# Patient Record
Sex: Female | Born: 1940 | Race: White | Hispanic: No | Marital: Married | Smoking: Current some day smoker
Health system: Southern US, Community
[De-identification: ages and names within clinical notes are randomized; demographics above are authoritative.]

## PROBLEM LIST (undated history)

## (undated) DIAGNOSIS — M1990 Unspecified osteoarthritis, unspecified site: Secondary | ICD-10-CM

## (undated) DIAGNOSIS — I739 Peripheral vascular disease, unspecified: Secondary | ICD-10-CM

## (undated) DIAGNOSIS — I10 Essential (primary) hypertension: Secondary | ICD-10-CM

## (undated) HISTORY — PX: PR VEIN BYPASS GRAFT,AORTO-FEM-POP: 35551

## (undated) HISTORY — DX: Unspecified osteoarthritis, unspecified site: M19.90

## (undated) HISTORY — PX: BELOW KNEE LEG AMPUTATION: SUR23

## (undated) HISTORY — DX: Peripheral vascular disease, unspecified: I73.9

## (undated) HISTORY — DX: Essential (primary) hypertension: I10

## (undated) HISTORY — PX: TOE AMPUTATION: SHX809

---

## 2007-01-07 ENCOUNTER — Ambulatory Visit: Payer: Self-pay | Admitting: Vascular Surgery

## 2007-01-10 ENCOUNTER — Ambulatory Visit: Payer: Self-pay | Admitting: Vascular Surgery

## 2007-01-10 ENCOUNTER — Inpatient Hospital Stay (HOSPITAL_COMMUNITY): Admission: RE | Admit: 2007-01-10 | Discharge: 2007-01-19 | Payer: Self-pay | Admitting: Vascular Surgery

## 2007-01-11 ENCOUNTER — Encounter: Payer: Self-pay | Admitting: Vascular Surgery

## 2007-01-13 ENCOUNTER — Encounter (INDEPENDENT_AMBULATORY_CARE_PROVIDER_SITE_OTHER): Payer: Self-pay | Admitting: Orthopedic Surgery

## 2007-01-17 ENCOUNTER — Ambulatory Visit: Payer: Self-pay | Admitting: Physical Medicine & Rehabilitation

## 2007-02-04 ENCOUNTER — Ambulatory Visit: Payer: Self-pay | Admitting: Vascular Surgery

## 2007-03-18 ENCOUNTER — Ambulatory Visit: Payer: Self-pay | Admitting: Vascular Surgery

## 2007-03-25 ENCOUNTER — Ambulatory Visit (HOSPITAL_COMMUNITY): Admission: RE | Admit: 2007-03-25 | Discharge: 2007-03-25 | Payer: Self-pay | Admitting: Orthopedic Surgery

## 2007-05-16 ENCOUNTER — Ambulatory Visit: Payer: Self-pay | Admitting: Vascular Surgery

## 2007-06-08 ENCOUNTER — Ambulatory Visit: Payer: Self-pay | Admitting: Vascular Surgery

## 2007-06-09 ENCOUNTER — Ambulatory Visit (HOSPITAL_COMMUNITY): Admission: RE | Admit: 2007-06-09 | Discharge: 2007-06-09 | Payer: Self-pay | Admitting: Vascular Surgery

## 2007-06-09 ENCOUNTER — Ambulatory Visit: Payer: Self-pay | Admitting: Surgery

## 2007-06-14 ENCOUNTER — Encounter: Payer: Self-pay | Admitting: Vascular Surgery

## 2007-06-14 ENCOUNTER — Inpatient Hospital Stay (HOSPITAL_COMMUNITY): Admission: RE | Admit: 2007-06-14 | Discharge: 2007-06-20 | Payer: Self-pay | Admitting: Vascular Surgery

## 2007-06-15 ENCOUNTER — Encounter: Payer: Self-pay | Admitting: Vascular Surgery

## 2007-06-20 ENCOUNTER — Ambulatory Visit: Payer: Self-pay | Admitting: Physical Medicine & Rehabilitation

## 2007-06-24 ENCOUNTER — Ambulatory Visit: Payer: Self-pay | Admitting: Vascular Surgery

## 2007-07-01 ENCOUNTER — Ambulatory Visit: Payer: Self-pay | Admitting: Vascular Surgery

## 2007-07-13 ENCOUNTER — Ambulatory Visit: Payer: Self-pay | Admitting: Vascular Surgery

## 2007-07-22 ENCOUNTER — Ambulatory Visit: Payer: Self-pay | Admitting: Vascular Surgery

## 2007-08-11 ENCOUNTER — Ambulatory Visit (HOSPITAL_COMMUNITY): Admission: RE | Admit: 2007-08-11 | Discharge: 2007-08-12 | Payer: Self-pay | Admitting: Orthopedic Surgery

## 2007-08-11 ENCOUNTER — Encounter (INDEPENDENT_AMBULATORY_CARE_PROVIDER_SITE_OTHER): Payer: Self-pay | Admitting: Orthopedic Surgery

## 2007-10-05 ENCOUNTER — Ambulatory Visit (HOSPITAL_COMMUNITY): Admission: RE | Admit: 2007-10-05 | Discharge: 2007-10-06 | Payer: Self-pay | Admitting: Orthopedic Surgery

## 2007-10-05 ENCOUNTER — Encounter (INDEPENDENT_AMBULATORY_CARE_PROVIDER_SITE_OTHER): Payer: Self-pay | Admitting: Orthopedic Surgery

## 2007-11-18 ENCOUNTER — Ambulatory Visit: Payer: Self-pay | Admitting: Vascular Surgery

## 2007-11-30 ENCOUNTER — Encounter (INDEPENDENT_AMBULATORY_CARE_PROVIDER_SITE_OTHER): Payer: Self-pay | Admitting: Orthopedic Surgery

## 2007-11-30 ENCOUNTER — Inpatient Hospital Stay (HOSPITAL_COMMUNITY): Admission: RE | Admit: 2007-11-30 | Discharge: 2007-12-02 | Payer: Self-pay | Admitting: Orthopedic Surgery

## 2007-12-01 ENCOUNTER — Ambulatory Visit: Payer: Self-pay | Admitting: Physical Medicine & Rehabilitation

## 2007-12-02 ENCOUNTER — Ambulatory Visit: Payer: Self-pay | Admitting: Physical Medicine & Rehabilitation

## 2007-12-02 ENCOUNTER — Inpatient Hospital Stay (HOSPITAL_COMMUNITY)
Admission: RE | Admit: 2007-12-02 | Discharge: 2007-12-16 | Payer: Self-pay | Admitting: Physical Medicine & Rehabilitation

## 2008-02-17 ENCOUNTER — Ambulatory Visit: Payer: Self-pay | Admitting: Vascular Surgery

## 2008-02-22 ENCOUNTER — Ambulatory Visit (HOSPITAL_COMMUNITY): Admission: RE | Admit: 2008-02-22 | Discharge: 2008-02-22 | Payer: Self-pay | Admitting: Orthopedic Surgery

## 2008-06-07 ENCOUNTER — Encounter: Admission: RE | Admit: 2008-06-07 | Discharge: 2008-08-09 | Payer: Self-pay | Admitting: Orthopedic Surgery

## 2008-11-16 ENCOUNTER — Ambulatory Visit: Payer: Self-pay | Admitting: Vascular Surgery

## 2009-06-07 ENCOUNTER — Ambulatory Visit: Payer: Self-pay | Admitting: Vascular Surgery

## 2010-01-24 ENCOUNTER — Ambulatory Visit: Payer: Self-pay | Admitting: Vascular Surgery

## 2010-08-19 NOTE — Procedures (Signed)
LOWER EXTREMITY ARTERIAL EVALUATION-SINGLE LEVEL   INDICATION:  Followup bypass graft.   HISTORY:  Diabetes:  Yes.  Cardiac:  No.  Hypertension:  Yes.  Smoking:  No.  Previous Surgery:  Left superficial femoral to below-knee popliteal  artery bypass graft with reverse saphenous vein on 01/10/2007, by Dr.  Arbie Cookey.   RESTING SYSTOLIC PRESSURES: (ABI)                          RIGHT                LEFT  Brachial:               170                  170  Anterior tibial:        120 (0.71)           142 (0.83)  Posterior tibial:       110                  134  Peroneal:  DOPPLER WAVEFORM ANALYSIS:  Anterior tibial:        Monophasic.          Monophasic.  Posterior tibial:       Monophasic.          Biphasic.  Peroneal:   PREVIOUS ABI'S:  Date: 01/11/2007  RIGHT:  0.80  LEFT:  0.59   IMPRESSION:  1. Right ABI is slightly decreased.  2. Left ABI is improved from previous study.   ___________________________________________  [name], M.D.   DP/MEDQ  D:  02/04/2007  T:  02/06/2007  Job:  [id number]

## 2010-08-19 NOTE — Op Note (Signed)
NAME:  [name] [name], HARLACHER NO.:  [id number]   MEDICAL RECORD NO.:  [id number]          PATIENT TYPE:  INP   LOCATION:  2807                         FACILITY:  [facility]   PHYSICIAN:  Di Kindle. Edilia Bo, M.D.DATE OF BIRTH:  11-25-1940   DATE OF PROCEDURE:  01/10/2007  DATE OF DISCHARGE:                               OPERATIVE REPORT   PREOPERATIVE DIAGNOSIS:  Nonhealing wound of the left foot.   POSTOPERATIVE DIAGNOSIS:  Nonhealing wound of the left foot.   PROCEDURES:  1. Aortogram.  2. Bilateral iliac arteriogram.  3. Bilateral lower extremity runoff.  4. Selective left external iliac arteriogram.   INDICATIONS:  This is a 70 year old woman who presented with a  nonhealing wound of the left foot.  She was evaluated by Dr. Arbie Cookey, and  arteriography was recommended in order to determine if she was a  candidate for revascularization.   TECHNIQUE:  The patient was taken to the PV Lab at [facility].  She was  sedated with 1 mg of Versed and 25 mcg of fentanyl.  Both groins were  prepped and draped in the usual sterile fashion.  After the skin was  infiltrated with 1% lidocaine, the right common femoral artery was  cannulated and a guidewire introduced into the infrarenal aorta under  fluoroscopic control.  A 5-French sheath was introduced over the wire.  The dilator was removed and then a pigtail catheter advanced over the  wire and positioned at the L1 vertebral body.  Flush aortogram was  obtained.  Catheter was then repositioned above the aortic bifurcation,  and an oblique iliac projection was obtained.  Bilateral lower extremity  runoff films were then obtained.  Next, the pigtail catheter was  exchanged for an IMA catheter which was positioned into the proximal  left common iliac artery.  An angled Glidewire was advanced down into  the external iliac artery, and then the IMA catheter was exchanged for  an end-hole catheter.  Selective spot films were obtained on the  left of  the left leg.   FINDINGS:  There are single renal arteries bilaterally, with no  significant renal artery stenosis identified.  The infrarenal aorta is  widely patent, with no significant stenosis.  Bilateral common iliac,  external iliac, and hypogastric arteries are patent.   On the right side, the common femoral, superficial femoral, and deep  femoral artery are patent.  There is some mild disease at the above-knee  popliteal artery.  Below-knee popliteal artery is patent.  There is  diffuse disease of the anterior tibial and posterior tibial arteries,  which both reconstitute distally.  The peroneal artery is patent on the  right.   On the left side, the common femoral and deep femoral artery are patent.  The superficial femoral artery is a patent down to the adductor canal.  The above-knee popliteal artery has an occlusion  over about 4-5 cm.  There is moderate disease of the popliteal artery at the level of the  knee.  Below-knee popliteal artery is patent.  There is three-vessel  runoff on the left, although there is  some mild disease in the proximal  anterior tibial artery on the left.   CONCLUSIONS:  1. No significant aortoiliac occlusive disease.  2. Bilateral superficial femoral artery occlusive disease at the      adductor canal.  3. Tibial occlusive disease, as described above.      Di Kindle. Edilia Bo, M.D.  Electronically Signed     CSD/MEDQ  D:  01/10/2007  T:  01/10/2007  Job:  [id number]

## 2010-08-19 NOTE — Assessment & Plan Note (Signed)
OFFICE VISIT   [name] [name], [name] [name]  DOB:  1940/10/03                                       07/01/2007  ZOXWR#:[id number]   The patient presents today for continued followup of her right foot.  She continues to have a palpable graft pulse in the popliteal space.  She does have an eschar over her posterior lateral heel, which is  approximately 2 cm in diameter.  Her sutures were removed from her fifth  toe amputation today.  She has an eschar over the tips of her great toe  on the right.  I am pleased with her overall progress.  She will  continue her usual bathing and dry dressing to her foot.  I will see her  again in 2 weeks for continued followup.   [name], M.D.  Electronically Signed   TFE/MEDQ  D:  07/01/2007  T:  07/04/2007  Job:  [id number]

## 2010-08-19 NOTE — H&P (Signed)
HISTORY AND PHYSICAL EXAMINATION   January 07, 2007   Re:  [name], [name]                 DOB:  12-20-1940   CHIEF COMPLAINT:  Left foot ulceration.   HISTORY OF PRESENT ILLNESS:  The patient is a 70 year old white female  with a 2 month history of progressive tissue loss in the left lateral  foot.  She has been followed extensively there by Dr. Javier Glazier at  a wound center and has had progressive tissue loss despite antibiotics  and local wound care.  I have seen her for further evaluation and lower  extremity arterial Doppler studies confirmed severe left lower extremity  arterial insufficiency and therefore she is admitted on 01/10/2007 for  theratography and probable bypass on 01/11/2007 depending on her  arteriogram.   PAST MEDICAL HISTORY:  Significant for insulin dependent diabetes for 4  years, she is hypertensive, does not have elevated cholesterol, no prior  cardiac history.   SOCIAL HISTORY:  She is married with 2 children.  Does not smoke or  drink alcohol.   REVIEW OF SYSTEMS:  She is 5 foot 7 inches tall.  She has no cardiac  history, specifically no atrial fibrillation or coronary artery disease.  No pulmonary disease.  She does have a history of peptic ulcer disease  from GI review.  No kidney issues.  She essentially has no history of  renal insufficiency.  She does have a history of arthritis and poor  eyesight.   ALLERGIES:  No drug allergies.   MEDICATIONS:  1. Metoprolol 100 mg 2 daily.  2. Glimepiride 4 mg 2 daily.  3. Citalopram 20 mg 1 daily.  4. Omeprazole 20 mg daily.  5. Lisinopril 10 mg daily.  6. Iron sulfate 325 mg 2 daily.  7. Lipitor 10 mg daily.  8. Centrum Silver 1 daily.  9. Aspirin 1 daily.  10.Amoxicillin p.o.  11.Zyvox p.o.   PHYSICAL EXAMINATION:  General:  Well developed and well nourished white  female appearing stated age of 2.  Vital signs:  Blood pressure is  126/60, pulse 66, respirations 22.   Carotid arteries without bruits  bilaterally.  She has 2+ radial pulses, 2+ femoral pulses bilaterally.  She has absent left popliteal and distal pulses and absent right distal  pulses.  Her left foot is noted for extensive necrotic ulcer over the  lateral mid foot extending into her metatarsal bone with bone exposed.   She has noninvasive vascular laboratory studies from [facility] and  this reveals an ABI  of 0.3 on the left and 0.6 on the right.  I had a  long discussion with the patient and her husband present.  I explained  that she clearly does not have adequate flow to heal this wound.  I am  also concerned that with her ulcerations that even with the normal flow  it will be quite difficult to get adequate coverage with a partial foot  amputation.  She is scheduled for urgent arteriogram on 10/06 with Dr.  Edilia Bo and will tentatively undergo angioplasty with possible bypass by  me on 10/07.  She and her husband understand that there is a high risk  for limb loss related to this process.   [name], M.D.  Electronically Signed   TFE/MEDQ  D:  01/07/2007  T:  01/10/2007  Job:  [id number]   cc:   Javier Glazier, M.D.  Shari Prows, M.D.

## 2010-08-19 NOTE — Op Note (Signed)
NAME:  [name] [name], [name] [name] NO.:  [id number]   MEDICAL RECORD NO.:  [id number]          PATIENT TYPE:  INP   LOCATION:  2550                         FACILITY:  [facility]   PHYSICIAN:  [name], M.D.    DATE OF BIRTH:  February 06, 1941   DATE OF PROCEDURE:  DATE OF DISCHARGE:                               OPERATIVE REPORT   PREOPERATIVE DIAGNOSIS:  Nonhealing left foot wound with ischemia.   POSTOPERATIVE DIAGNOSIS:  Nonhealing left foot wound with ischemia.   PROCEDURE:  Left superficial femoral artery to below-knee popliteal  artery bypass with reverse saphenous vein.   SURGEON:  [name], M.D.   ASSISTANT:  Jerold Coombe, P.A.-C   ANESTHESIA:  General endotracheal.   COMPLICATIONS:  None.  He was dismissed from the recovery room stable.   PROCEDURE IN DETAILS:  The patient was taken to the room and placed in  position where the area of the left groin and left leg were prepped and  draped in the usual sterile fashion.  Ultrasound was used to visualize  the saphenous vein, which was very superficial and of moderate size.  The vein was exposed through the below-knee calf and incision was made  from this level to the mid thigh.  The vein was harvested.  The  tributary branches were ligated with 3-0 and 4-0 silk ties and divided.  The vein was ligated distally and was gently dilated and was adequate  for anastomosis and bypass.  The below-knee popliteal artery was exposed  through the medial approach and was somewhat calcified, but did have  area possible for anastomosis.  Next, the superficial femoral artery was  exposed through the medial approach through the same vein harvest  incision and the mid thigh.  The artery had a good pulse at this level  and was controlled with vessel loops.  A tunnel was created between the  level of the below-knee popliteal artery and the superficial femoral  artery.  The patient was given 8,000 units of intravenous heparin.  After adequate circulation time, the superficial femoral artery was  occluded proximally and distally and was opened with a 11 blade and  incision extended with Potts scissors.  The vein was brought into the  field.  It was reversed, spatulated, and sewn end-to-side to the mid to  distal superficial femoral artery with a running 6-0 Prolene suture.  The anastomosis was tested and found to be adequate.  The vein was then  brought through the prior created tunnel down to the level of the below-  knee popliteal artery.  The below-knee popliteal artery was occluded  proximally and distally with vessel loops and was opened with a 11 blade  and incision extended with Potts scissors.  There was good back-bleeding  from the artery.  The vein was spatulated and sewn end-to-side to the  artery with a running 6-0 Prolene suture.  Prior to completion of the  anastomosis, the usual flushing maneuvers were undertaken.  The  anastomosis was completed, clamps were removed and good graft dependent  Doppler flow was noted in the  posterior tibial and peroneal arteries.  The patient was given 50 mg of protamine to reverse the heparin.  The  wound  was irrigated with saline.  Hemostasis with electrocautery.  Wounds were  closed with 2-0 Vicryl in the subcutaneous tissue and fascia.  The skin  was closed with 3-0 subcuticular Vicryl stitch.  A sterile dressing was  applied.  The patient was taken to the recovery room in stable  condition.      [name], M.D.  Electronically Signed     TFE/MEDQ  D:  01/11/2007  T:  01/11/2007  Job:  [id number]

## 2010-08-19 NOTE — Op Note (Signed)
NAME:  [name] [name], [name] [name] NO.:  [id number]   MEDICAL RECORD NO.:  [id number]          PATIENT TYPE:  OIB   LOCATION:  5039                         FACILITY:  [facility]   PHYSICIAN:  Nadara Mustard, MD     DATE OF BIRTH:  [birth year]   DATE OF PROCEDURE:  08/11/2007  DATE OF DISCHARGE:                               OPERATIVE REPORT   PREOPERATIVE DIAGNOSES:  Osteomyelitis and gangrene, right great toe.   POSTOPERATIVE DIAGNOSES:  Osteomyelitis and gangrene, right great toe.   PROCEDURE:  Right foot first ray amputation.   SURGEON:  Nadara Mustard, MD.   ANESTHESIA:  General.   ESTIMATED BLOOD LOSS:  Minimal.   ANTIBIOTICS:  Kefzol 1 g.   DRAINS:  None.   COMPLICATIONS:  None.   TOURNIQUET TIME:  None.   DISPOSITION:  PACU in stable condition.   INDICATIONS FOR PROCEDURE:  The patient is a 70 year old woman with type  2 diabetes, peripheral vascular disease status post revascularization  with some ischemic changes to the vascular incision with gangrene of the  great toe with osteomyelitis.  She has failed conservative care.  She  has had progressive gangrenous changes and presented at this time for  first ray amputation.  Risks and benefits were discussed including  infection, neurovascular injury, nonhealing of the wound, and need for  the higher-level amputation.  The patient states she understands and  wish to proceed at this time.   DESCRIPTION OF PROCEDURE:  The patient was brought to the OR room-5 and  underwent a general anesthetic.  After adequate level of anesthesia  obtained, the patient's right lower extremity was prepped using DuraPrep  and draped in a sterile field.  A racket incision was made around the  great toe.  The great toe was resected through the base of the first  metatarsal and the great toe first metatarsal and sesamoids were  resected in one block of tissue.  The wound was irrigated with normal  saline.  The electrocautery was used  for hemostasis.  The wound was  closed using  a 2-0 nylon with a modified vertical mattress suture.  The wound was  covered with Adaptic, orthopedic sponges, sterile Kerlix and a Coban  compressive wrap was applied for the entire right lower extremity due to  her venostasis insufficiency and swelling.  She will be admitted for 23-  hour observation and discharged in the morning.      Nadara Mustard, MD  Electronically Signed     MVD/MEDQ  D:  08/11/2007  T:  08/12/2007  Job:  [id number]

## 2010-08-19 NOTE — Procedures (Signed)
BYPASS GRAFT EVALUATION   INDICATION:  Follow-up evaluation of lower extremity bypass graft.   HISTORY:  Diabetes:  Yes.  Cardiac:  No.  Hypertension:  Yes.  Smoking:  Rarely.  Previous Surgery:  Left superficial femoral to below-knee pop bypass  graft with reverse saphenous vein on 01/10/07 by Dr. Arbie Cookey.   SINGLE LEVEL ARTERIAL EXAM                               RIGHT              LEFT  Brachial:                    166                168  Anterior tibial:             20                 108  Posterior tibial:            38                 130  Peroneal:  Ankle/brachial index:        0.23               0.78   PREVIOUS ABI:  Date: 02/04/07  RIGHT:  0.71  LEFT:  0.83   LOWER EXTREMITY BYPASS GRAFT DUPLEX EXAM:   DUPLEX:  1. Doppler arterial waveforms are biphasic proximal to, within, and      distal to the bypass graft with a peak systolic velocity of 335      cm/s with a proximal anastomosis of the graft.  2. On the right, waveforms are biphasic in the common femoral and      superficial femoral artery and monophasic in the popliteal artery      with a peak systolic velocity of 266 cm/s at the proximal portion      of the right popliteal artery.   IMPRESSION:  1. Left ankle brachial index is stable from previous study.  2. Patent left superficial femoral to popliteal artery bypass graft.  3. Right ankle brachial index is lower than previously recorded with a      right popliteal artery stenosis.   ___________________________________________  [name], M.D.   MC/MEDQ  D:  05/16/2007  T:  05/17/2007  Job:  [id number]

## 2010-08-19 NOTE — Procedures (Signed)
BYPASS GRAFT EVALUATION   INDICATION:  Followup left fem to pop bypass grafts.   HISTORY:  Diabetes:  Yes.  Cardiac:  No.  Hypertension:  Yes.  Smoking:  Yes.  Previous Surgery:  Left superficial femoral artery to popliteal bypass  graft, 01/10/2007.  Right superficial femoral artery to popliteal bypass  graft, 06/14/2007.  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   SINGLE LEVEL ARTERIAL EXAM                               RIGHT              LEFT  Brachial:                    189                185  Anterior tibial:                                128  Posterior tibial:                               158  Peroneal:  Ankle/brachial index:        BKA                0.84   PREVIOUS ABI:  Date:  06/07/2009  RIGHT:  BKA  LEFT:  0.81   LOWER EXTREMITY BYPASS GRAFT DUPLEX EXAM:   DUPLEX:  Patent left femoral-popliteal bypass graft with elevated  velocities at the proximal anastomosis of 485 cm/sec.   IMPRESSION:  1. Right below the knee amputation.  2. Stable ankle brachial index from previous study.  3. Patent left femoral-popliteal bypass graft with elevated      velocities, as described above.   ___________________________________________  [name], M.D.   EM/MEDQ  D:  01/24/2010  T:  01/24/2010  Job:  [id number]

## 2010-08-19 NOTE — Op Note (Signed)
NAME:  [name] [name], [name] [name] NO.:  [id number]   MEDICAL RECORD NO.:  [id number]          PATIENT TYPE:  OIB   LOCATION:  5003                         FACILITY:  [facility]   PHYSICIAN:  Nadara Mustard, MD     DATE OF BIRTH:  January 20, 1941   DATE OF PROCEDURE:  DATE OF DISCHARGE:                               OPERATIVE REPORT   PREOPERATIVE DIAGNOSES:  1. Gangrene and osteomyelitis, right forefoot.  2. Achilles contracture.   POSTOPERATIVE DIAGNOSES:  1. Gangrene and osteomyelitis, right forefoot.  2. Achilles contracture.   PROCEDURE:  1. Right transmetatarsal amputation.  2. Percutaneous Achilles lengthening.   SURGEON:  Nadara Mustard, MD   ANESTHESIA:  General.   ESTIMATED BLOOD LOSS:  Minimal.   ANTIBIOTICS:  1 g of Kefzol.   DRAINS:  None.   COMPLICATIONS:  None.   TOURNIQUET TIME:  None.   SPECIMEN:  Sent to pathology.   DISPOSITION:  To the PACU in stable condition.   INDICATIONS FOR PROCEDURE:  The patient is a 70 year old woman who is  status post medial and lateral column amputations.  She has developed  progressive infection over the second metatarsal with an open wound with  a Wagner grade 3 ulcer.  Due to the progressive wound necrosis distally,  the patient is not felt to be a good candidate for additional ray  amputation and presents at this time for transmetatarsal amputation.  She does have Achilles contracture, and we will plan for Achilles  lengthening.  Risks and benefits were discussed including infection,  neurovascular injury, persistent pain, nonhealing wound, and need for  additional surgery.  The patient states she understands and wished to  proceed at this time.   DESCRIPTION OF PROCEDURE:  The patient was brought to OR room 15,  underwent general anesthetic.  After adequate level of anesthesia was  obtained, the patient's right lower extremity was prepped using DuraPrep  and draped into a sterile field.  A fishmouth incision was  made, and a  beveled transmetatarsal amputation was performed.  The plantar aspect of  the bones was beveled and dorsally this was also beveled as well.  The  wound was irrigated with normal saline, and the skin was closed using 2-  0 nylon without any tension on the skin.  A percutaneous Z Achilles  lengthening was then performed with equinus contracture of about 10  degrees.  Preoperatively and postoperatively, she had dorsiflexion of  about 30 degrees.  The wounds were covered with Adaptic, orthopedic  sponges, ABD dressing, Kerlix, and Coban.  The patient was extubated and  taken to the PACU in stable condition.  Plan for a 24-hour observation.  Discharge to home tomorrow.      Nadara Mustard, MD  Electronically Signed     MVD/MEDQ  D:  10/05/2007  T:  10/06/2007  Job:  [id number]

## 2010-08-19 NOTE — Op Note (Signed)
NAME:  [name] [name], [name] [name] NO.:  [id number]   MEDICAL RECORD NO.:  [id number]          PATIENT TYPE:  AMB   LOCATION:  SDS                          FACILITY:  [facility]   PHYSICIAN:  Nadara Mustard, MD     DATE OF BIRTH:  08-13-1940   DATE OF PROCEDURE:  02/22/2008  DATE OF DISCHARGE:                               OPERATIVE REPORT   PREOPERATIVE DIAGNOSIS:  Chronic wound, right transtibial amputation.   POSTOPERATIVE DIAGNOSIS:  Chronic wounds, right transtibial amputation.   PROCEDURE:  Irrigation and debridement of the wound and applied 1 unit  of Apligraf.   SURGEON:  Nadara Mustard, MD.   ANESTHESIA:  None.   DRAINS:  None.   COMPLICATIONS:  None.   DISPOSITION:  To home.   INDICATIONS FOR PROCEDURE:  The patient is a 67-year-woman, status post  transtibial amputation on the right with a chronic wound, which has not  healed.  She presents at this time for application of Apligraf.   DESCRIPTION OF PROCEDURE:  The patient was brought for short stay.  The  wound was cleansed with saline.  Fibrinous exudate was removed from the  wound.  Apligraf was meshed, applied to the wound, and Mepilex dressings  with Bactroban was applied with compressive Coban dressing.  The patient  was discharged to home.  Follow up in the office on Friday.      Nadara Mustard, MD  Electronically Signed     MVD/MEDQ  D:  02/22/2008  T:  02/23/2008  Job:  [id number]

## 2010-08-19 NOTE — Procedures (Signed)
BYPASS GRAFT EVALUATION   INDICATION:  Followup, bilateral lower extremity bypass graft.   HISTORY:  Diabetes:  Yes.  Cardiac:  No.  Hypertension:  Yes.  Smoking:  Rarely.  Previous Surgery:  Left SFA to popliteal artery bypass graft on 01/10/07  by Dr. Arbie Cookey.  Right SFA to popliteal bypass graft on 06/14/07 by Dr.  Arbie Cookey.  Partial right foot amputation, July, 2009.   SINGLE LEVEL ARTERIAL EXAM                               RIGHT              LEFT  Brachial:                    184                176  Anterior tibial:             130                148  Posterior tibial:            110                150  Peroneal:  Ankle/brachial index:        0.71               0.81   PREVIOUS ABI:  Date: 06/15/07  RIGHT:  0.56  LEFT:  0.72   LOWER EXTREMITY BYPASS GRAFT DUPLEX EXAM:   DUPLEX:  1. Doppler arterial waveforms on the right appear monophasic proximal      to, within, and distal to bypass graft, on left biphasic.  2. Elevated velocities in right proximal anastomosis of 226 cm/s,      right mid/distal graft of 491 cm/s.  On the left, proximal      anastomosis velocities of 407 cm/s and in the proximal graft of 373      cm/s.     IMPRESSION:  1. Right ankle brachial index shows increase from a previous study and      left appears stable; however, bilateral monophasic Doppler signals      were noted at the ankles.  2. Elevated velocities noted in bilateral proximal anastomoses and      within the grafts.  3. Note:  Right foot bandage and postoperative conditions.      ___________________________________________  [name], M.D.   AS/MEDQ  D:  11/18/2007  T:  11/18/2007  Job:  [id number]

## 2010-08-19 NOTE — Assessment & Plan Note (Signed)
OFFICE VISIT   [name], [name]  DOB:  November 03, 1940                                       07/22/2007  CHART#:[id number]   [name] is here today for followup of her right fem-pop bypass.  I  had seen her 10 days ago with some new erythema at her below-knee  popliteal incision.  This is now opened slightly.  This was debrided in  the office today and packed.  She will begin packing this on this on a  daily basis.  Her foot actually looks quite good.  Her 5th toe  amputation is healed.  She has nice separating of the eschar on her heel  and her great toe now is healing as well.  We will see her again in 3  weeks for continued followup.   [name], M.D.  Electronically Signed   TFE/MEDQ  D:  07/22/2007  T:  07/25/2007  Job:  [id number]

## 2010-08-19 NOTE — H&P (Signed)
NAME:  VIVION, ROMANO NO.:  [id number]   MEDICAL RECORD NO.:  [id number]          PATIENT TYPE:  IPS   LOCATION:  4010                         FACILITY:  [facility]   PHYSICIAN:  Ranelle Oyster, M.D.DATE OF BIRTH:  December 10, 1940   DATE OF ADMISSION:  12/02/2007  DATE OF DISCHARGE:                              HISTORY & PHYSICAL   CHIEF COMPLAINT:  Right leg/foot pain.   HISTORY OF PRESENT ILLNESS:  This is a 70 year old white female with  diabetes and hypertension with chronic peripheral vascular disease  status post multiple bypass grafts and amputations.  She had a right  great toe amputation with gangrenous changes and osteomyelitis.  She  underwent a right transmetatarsal amputation in July 2009 with attempts  at limb salvage.  She continued to have infection and abscess formation  of right foot ultimately requiring a right transtibial amputation on  November 30, 2007.  The patient has been followed by PT and OT and still  needs significant assistance with mobility and self-care, and thus it is  felt after rehab evaluation that she could benefit from an inpatient  rehab stay.   REVIEW OF SYSTEMS:  Notable for occasional bladder incontinence,  although she states that her habits are normal.  Pain has been under  fair control. Mood has been poor.  She denies shortness of breath or  chest pain.  Full review is in the written H and P.   PAST MEDICAL HISTORY:  Positive for diabetes type 2, insulin dependent,  CVA with left hemiparesis that is resolved, hypertension, dyslipidemia,  depression, PVD with left fem-pop bypass graft in October 2008, diabetic  neuropathy, history of peptic ulcer disease, right toe amputation in May  2009, right transmetatarsal amputation in July 2009, laser surgery in  bilateral eyes for diabetic retinopathy, left fifth toe amputation in  October 2008, and I&D in December 2008.   FAMILY HISTORY:  Positive for heart valve disease.   SOCIAL HISTORY:  The patient is married, lives in one-level house with  no steps to enter.  Husband is an amputee.  She has hired help 5 times a  week for about 4 hours with friends and family assisting as needed.  She  smokes 2-3 cigarettes per day.  She does not drink.   FUNCTIONAL STATUS:  The patient was independent for transfers and  limited ambulation since her right foot surgeries.  She is  nonweightbearing right lower extremity and used a walker and wheelchair  prior to coming in.  Currently, she is max assist for sit to stand  transfer, min assist for scoot to edge of the bed and to stand on left  leg.   MEDICATIONS AT HOME:  1. Hydrocodone p.r.n.  2. Doxycycline 100 mg b.i.d.  3. Lantus insulin 35 units q.h.s.  Sliding scale insulin 5 units      Humalog plus additional according to blood sugar with meals.  4. Celexa 20 mg daily.  5. Omeprazole 20 mg daily.  6. Amlodipine 10 mg daily.  7. Metoprolol 100 mg b.i.d.  8. Lisinopril 10 mg daily.  9.  Iron sulfate 325 mg daily.  10.Lipitor 10 mg daily.  11.Vitamin B6 100 mg b.i.d.   LABS:  Hemoglobin 10.8, white count 11,000, platelets 322,000, sodium  137, potassium 4.9, BUN and creatinine 6 and 0.8.   PHYSICAL EXAMINATION:  Blood pressure 129/56, pulse is 70, respiratory  rate 18, temperature is 98.5.  The patient is lying in bed, appears a  bit fat, in no acute distress.  Pupils equal, round, and reactive to  light.  Ears, nose, and throat exam is unremarkable.  Dentition was poor  with several missing teeth.  Neck is supple without JVD or  lymphadenopathy.  Chest is notable for occasional rhonchi, but otherwise  unremarkable.  Heart is regular rate and rhythm with systolic murmur.  Extremities showed no clubbing, cyanosis or edema.  Right leg was  wrapped in immediate postoperative dressing.  Abdomen is soft and  nontender.  Bowel sounds are positive.  Skin is generally intact.  On  the left foot, she did have some  dryness along the lateral aspect of the  foot.  Neurologically cranial nerves II-XII revealed decreased visual  acuity on eye exam, otherwise unremarkable.  Reflexes are 1+.  Sensation  is decreased distally.  Strength is generally 4-5/5 in the upper  extremities.  She is able to lift the right leg briefly against gravity.  Left lower extremity is 2+ to 3/5 proximal and 3/5 distally with some  pain noted behind the knee with lifting.  Judgment and orientation were  normal.  Memory was intact and mood was flat.   ASSESSMENT AND PLAN:  1. 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  The patient was admitted to receive      collaborative interdisciplinary care between the physiatrist, rehab      nursing staff, and therapy team here on the Inpatient Rehab Unit.      The patient's level of medical complexity and substantial therapy      needs in context of the medical necessity cannot be provided at a      lesser intensity of care.  Physiatrist will provide 24-hour mgt of      medical needs as well as oversight of therapy, plan treatment,      provide guidance as appropriate regarding the interaction of the      two.  24-hour rehab nursing will assist in the management of the      patient's skin care as well as pain, safety needs, appropriate      nutrition, and medication administration.  They also will help      integrate therapy concepts, techniques education, etc.  PT will      assess and treat for pre-ambulation mobility including wheelchair      transfers, wheelchair mobility, and appropriate safety and      equipment needs.  OT will assess and treat for range of motion in      the upper extremities as well as ADLs and safety as well as      equipment.  Case management/social worker will assess for      psychosocial needs and discharge planning.  Team conferences will      be held weekly to establish goals, assess progress, and  to      determine barriers at discharge.  Rehab goals are modified      independent at the wheelchair level.  Estimated length of stay is      10  days.  Prognosis good.  2. Diabetes type 2:  Continue with Lantus 35 units h.s. with a      sensitive sliding scale insulin upon admission to rehab.  Observe      CBGs daily and adjust as appropriate.  3. Hypertension:  Continue Norvasc, metoprolol, and Prinivil  will be      halted secondary to a recent increase in potassium up to 5.2 and      renal insufficiency.  Push fluids and recheck labs on Monday.  4. Deep vein thrombosis prophylaxis, subcu Lovenox.  5. Dyslipidemia:  Zocor.  6. Postoperative anemia:  Continue iron supplementation and recheck      blood count on Monday.  Observe for signs and symptoms of anemia      while with rehab.  7. Leukocytosis:  We will follow clinically for now.  There are no      symptoms of active infection.  We will check      CBC on Monday, December 05, 2007.  8. Pain management:  We will observe for now with p.r.n. oxycodone and      Tylenol as well as Robaxin for spasms.  Pain seems to be under fair      control at the moment.      Ranelle Oyster, M.D.  Electronically Signed     ZTS/MEDQ  D:  12/02/2007  T:  12/03/2007  Job:  [id number]

## 2010-08-19 NOTE — Assessment & Plan Note (Signed)
OFFICE VISIT   [name], [name]  DOB:  12/31/1940                                       02/04/2007  CHART#:[id number]   The patient presents today for followup of her admission for left  superficial femoral artery to below-knee popliteal artery bypass with a  reverse saphenous vein, and partial foot amputation by Dr. Aldean Baker.  She looks quite good today.  She has healed her leg incisions, and has a  good popliteal pulse.  Her ankle-brachial index has improved to 0.83 on  the left.  She does have good healing of her foot, and actually saw Dr.  Lajoyce Corners earlier today.  She does have an open area still on the lateral  aspect more proximally, but this does appear to be healing with no  evidence of infection.  I am quite pleased with her initial result.  Plan to see her again in 6 weeks for continued followup.   [name], M.D.  Electronically Signed   TFE/MEDQ  D:  02/04/2007  T:  02/08/2007  Job:  [id number]   cc:   Nadara Mustard, MD  Shari Prows, MD  Axel Filler, MD

## 2010-08-19 NOTE — Procedures (Signed)
BYPASS GRAFT EVALUATION   INDICATION:  Follow-up left lower extremity bypass graft.   HISTORY:  Diabetes:  Yes.  Cardiac:  No.  Hypertension:  Yes.  Smoking:  Rarely.  Previous Surgery:  Left superficial femoral artery to popliteal artery  bypass graft 01/10/2007.  Right superficial femoral artery to popliteal  artery bypass graft 06/14/2007 and right BKA 12/2007.   SINGLE LEVEL ARTERIAL EXAM                               RIGHT              LEFT  Brachial:                    175                180  Anterior tibial:                                151  Posterior tibial:                               132  Peroneal:  Ankle/brachial index:        BKA                0.84   PREVIOUS ABI:  Date: 02/17/2008  RIGHT:  BKA  LEFT:  0.98   LOWER EXTREMITY BYPASS GRAFT DUPLEX EXAM:   DUPLEX:  1. Doppler arterial waveforms appear biphasic proximal to the left      lower extremity bypass graft and monophasic within and distal to      it.  2. There are stable elevated velocities of 438 cm/s in the proximal      anastomosis.   IMPRESSION:  1. Right BKA.  2. Left superficial femoral artery to popliteal artery bypass graft      appears patent with stable elevated velocities in the proximal      anastomosis.  3. Left ABI shows decrease from previous study, however correlates      with studies prior to it.   ___________________________________________  [name], M.D.   AS/MEDQ  D:  11/16/2008  T:  11/16/2008  Job:  [id number]

## 2010-08-19 NOTE — Assessment & Plan Note (Signed)
OFFICE VISIT   [name] [name], [name] [name]  DOB:  [birth year]                                       06/08/2007  WGNFA#:[id number]   Patient presents today for evaluation of her right foot.  She is well  known to me from an urgent left superficial femoral to below-knee  popliteal artery bypass for a severe wound in her left foot with  threatened limb loss.  This was in October, 2008.  She fortunately has  totally healed this.  She has been followed in our office since then  with no new problem.  She now presents with a problem in the  contralateral right foot.  She reports that over the past week, she has  had dependent rubor and discoloration of her fifth toe with pain over  her fifth toe.  She does not recall any trauma specifically to this  area.   Her health history is otherwise unchanged.  She does have a history of  diabetes for four years, hypertensive, elevated cholesterol.  Has no  cardiac history.   SOCIAL HISTORY:  She is married with two children.  Does not smoke or  drink alcohol.   REVIEW OF SYSTEMS:  Negative for prior coronary artery disease.  She  does have a history of peptic ulcer disease.  No history of renal  insufficiency.  Does have arthritis and poor eyesight.   ALLERGIES:  None.   MEDICATIONS:  1. Metoprolol 100 mg p.o. b.i.d.  2. Amlodipine 10 mg p.o. daily.  3. Omeprazole 20 mg daily.  4. Glimepiride 4 mg b.i.d.  5. Lipitor 10 mg daily.  6. Ferrous sulfate 325 mg p.o. b.i.d.  7. Lantus insulin 30 units subcu nightly.  8. Lisinopril 10 mg p.o. daily.   PHYSICAL EXAMINATION:  A well-developed and well-nourished white female  appearing her stated age of 50.  Heart:  Regular rate and rhythm.  Chest:  Clear bilaterally.  Her radial pulses are 2+.  She has 2+  femoral pulses bilaterally.  Her left foot is totally healed with the  left fifth toe amputation.  On her right foot, she does have dependent  rubor and does have nonviable fifth  toe.   She underwent repeat ankle-arm index today, and this reveals an ankle-  arm index drop to 0.17 on the right and 0.85 on the left.   I explained to the patient that she clearly has had some change in her  arterial flow in her right foot since her arteriogram in October.  I  have recommended repeat arteriography for further evaluation.  I suspect  she will require either endovascular treatment of superficial femoral  occlusive disease or fem-pop bypass.  She does not have any evidence of  infection or critical issues at this time but does need to be taken care  of within the next week's time frame.  She will undergo arteriography  tomorrow on March 5 with plans pending.   [name], M.D.  Electronically Signed   TFE/MEDQ  D:  06/08/2007  T:  06/09/2007  Job:  [id number]

## 2010-08-19 NOTE — Op Note (Signed)
NAME:  LILIANE, MALLIS NO.:  [id number]   MEDICAL RECORD NO.:  [id number]          PATIENT TYPE:  AMB   LOCATION:  SDS                          FACILITY:  [facility]   PHYSICIAN:  Nadara Mustard, MD     DATE OF BIRTH:  Feb 07, 1941   DATE OF PROCEDURE:  03/25/2007  DATE OF DISCHARGE:                               OPERATIVE REPORT   PREOPERATIVE DIAGNOSIS:  Open chronic wound left foot.   POSTOPERATIVE DIAGNOSIS:  Open chronic wound left foot.   PROCEDURE:  Irrigation, debridement and cleansing of wound back to  bleeding viable granulation tissue and application of 1 unit of  Apligraf.   SURGEON:  Nadara Mustard, MD   ANESTHESIA:  None.   DICTATION ENDED AT THIS POINT      Nadara Mustard, MD  Electronically Signed     MVD/MEDQ  D:  03/25/2007  T:  03/26/2007  Job:  [id number]

## 2010-08-19 NOTE — Assessment & Plan Note (Signed)
OFFICE VISIT   [name] [name], [name] [name]  DOB:  [birth year]                                       06/24/2007  ZOXWR#:[id number]   [name] presents today for concern regarding a wound on her right  heel.  She recently underwent right distal superficial femoral artery to  below-knee popliteal artery bypass on 06/14/2007.  She also had a fifth  toe amputation.  Her wounds actually are healing quite nicely.  She does  have a palpable graft pulse in the popliteal space.  Her toe amputation  is healing quite nicely as well.  She does have a pressure sore on her  heel and is instructed on care of this.  I suspect this is related to  her hospitalization and not to any trauma.  She will continue her local  wound care to this.  We will direct this with keeping this area dry and  have instructed her on the importance of keeping pressure off this area.  Plan to see her again in 1 week for suture removal and wound check.   [name], M.D.  Electronically Signed   TFE/MEDQ  D:  06/24/2007  T:  06/27/2007  Job:  [id number]

## 2010-08-19 NOTE — Assessment & Plan Note (Signed)
OFFICE VISIT   [name] [name], [name] [name]  DOB:  [birth year]                                       07/13/2007  CHART#:[id number]   The patient presents today with concern regarding pain and erythema in  her right below knee popliteal incision.  She has had some serous  drainage from this as well.  The foot is well-perfused and her foot  actually looks good.  She has healed her fifth toe amputation site and  her heel is stable with an eschar present.  I have written her a  prescription for Keflex 500 mg t.i.d. for 10 days and will plan to see  her again on 04/17.  This does appear to be superficial cellulitis and  hopefully this will resolve quickly with antibiotics.  She will notify  should she develop any ongoing difficulty.   [name], M.D.  Electronically Signed   TFE/MEDQ  D:  07/13/2007  T:  07/14/2007  Job:  [id number]

## 2010-08-19 NOTE — Op Note (Signed)
NAME:  [name] [name], [name] [name] NO.:  [id number]   MEDICAL RECORD NO.:  [id number]          PATIENT TYPE:  INP   LOCATION:  2023                         FACILITY:  [facility]   PHYSICIAN:  Nadara Mustard, MD     DATE OF BIRTH:  19-Jan-1941   DATE OF PROCEDURE:  01/13/2007  DATE OF DISCHARGE:                               OPERATIVE REPORT   PREOPERATIVE DIAGNOSES:  1. Status post revascularization left lower extremity.  2. Ischemic lateral left foot ulcer with Wagner grade III ulceration      and osteomyelitis base of the fifth metatarsal and cuboid.   POSTOPERATIVE DIAGNOSES:  1. Status post revascularization left lower extremity.  2. Ischemic lateral left foot ulcer with Wagner grade III ulceration      and osteomyelitis base of the fifth metatarsal and cuboid.   PROCEDURE:  1. Amputation left foot fifth ray.  2. Partial amputation of the cuboid  3. Transfer of the abductor digiti quinti muscle proximally.   SURGEON:  Nadara Mustard, MD.   ANESTHESIA:  Ankle block .   ESTIMATED BLOOD LOSS:  Minimal.   ANTIBIOTICS:  Augmentin preoperatively.   DRAINS:  None.   COMPLICATIONS:  None.   TOURNIQUET TIME:  None.   DISPOSITION:  To PACU in stable condition.   INDICATIONS FOR PROCEDURE:  The patient is a 70 year old woman with left  lower extremity peripheral vascular disease.  She is status post  revascularization and has a good warm foot and presents at this time  after revascularization for foot salvage surgery.  The risks and  benefits were discussed including infection, neurovascular injury,  nonhealing of wound, need for a higher-level amputation.  The patient  states she understands and wishes to proceed at this time.   DESCRIPTION OF PROCEDURE:  The patient was brought to OR room 3 after  undergoing an ankle block.  After adequate levels of anesthesia were  obtained, the patient's left lower extremity was prepped using DuraPrep  and draped into a sterile  field.  A racquet-type incision was made  around the ulcer which was approximately 3 cm in diameter.  This  extended distally and the fifth ray was amputated in one block of  tissue.  The patient also had involvement of the base of the cuboid and  the base of the cuboid was also resected.  There was no remnants of the  ulcer or the infected bone within the base of the wound. The wound was  irrigated with normal saline.  The abductor digiti quinti was released  distally and transferred proximally to cover the bone and to cover the  wound defect.  The skin was closed using a far-near-near-far suture with  2-0 nylon.  There was no ischemic changes in the skin with the wound  closure.  The foot was kept at 90 degrees of abduction.  The wound was  covered with adaptic orthopedic sponges, ABD dressing, Webril and a  Coban dressing.  The patient was then taken to the PACU in stable  condition.  She will return to her room for physical therapy.  Nadara Mustard, MD  Electronically Signed     MVD/MEDQ  D:  01/13/2007  T:  01/13/2007  Job:  [id number]

## 2010-08-19 NOTE — Discharge Summary (Signed)
NAME:  [name] [name], [name] [name] NO.:  [id number]   MEDICAL RECORD NO.:  [id number]          PATIENT TYPE:  INP   LOCATION:  2035                         FACILITY:  [facility]   PHYSICIAN:  [name], M.D.    DATE OF BIRTH:  08-11-1940   DATE OF ADMISSION:  06/14/2007  DATE OF DISCHARGE:  06/20/2007                               DISCHARGE SUMMARY   ADMISSION DIAGNOSIS:  Peripheral vascular occlusive disease with  ischemic right foot and gangrene of right fifth toe.   DISCHARGE/SECONDARY DIAGNOSES:  Peripheral vascular occlusive disease  with ischemic right foot with gangrene of right fifth toe, status post  right femoral-popliteal artery bypass grafting and right fifth toe  amputation.  1. Diabetes mellitus type 2 on insulin.  2. Hypertension.  3. Hypercholesterolemia.  4. History of laser surgery on both eyes.  5. History of ongoing tobacco use (3 cigarettes per day and has smoked      for 30 years).  6. History of left superficial femoral artery to below-knee popliteal      artery bypass using a reversed saphenous vein graft October 2008.  7. History of peptic ulcer disease.  8. History of arthritis.  9. No known drug allergies.   BRIEF HISTORY:  [name] [name] is a 70 year old female who was seen by Dr.  Arbie Cookey in early March for evaluation of her right foot.  She was known to  him from an urgent left superficial femoral to below-knee popliteal  artery bypass for a severe wound in her left foot with right limb loss  in October 2008.  She has healed from this.  She has been followed at  the [facility] office periodically has had no new problems until recently when  she presented with dependent rubor and discoloration of her fifth toe  with pain over her fifth toe, which had developed over the previous  week.  She has denied any specific trauma to this area.  She is diabetic  and a smoker.  She had repeat ABIs, which revealed ankle brachial  indices decreased to 0.17 on the right  and on the left at 0.85.  Dr.  Arbie Cookey recommended that she undergo a repeat arteriogram which was done  on March 5, showing no evidence of aortoiliac disease with right SFA and  popliteal occlusion and single-vessel runoff via the peroneal artery  with reconstitution of the dorsalis pedis and posterior tibial at the  ankle.  Based on these findings, to Dr. Arbie Cookey recommend that she undergo  right femoral-popliteal artery bypass grafting.   PROCEDURES:  June 14, 2007, right distal superficial femoral artery to  below-knee popliteal artery bypass using a reversed saphenous vein, and  right fifth toe amputation by Dr. Arbie Cookey.   HOSPITAL COURSE:  On June 14, 2007, [name] [name] was electively admitted  to [facility] and underwent the previously-mentioned procedure.  She has remained stable postoperatively.  We did have PT, OT and rehab  medicine evaluate her during this hospitalization.  Initially it was  felt that since her husband was an amputee she would not have adequate  supervision at home with mobility and therefore it was recommended that  she would have short-term skilled nursing care at a nursing home  facility.  However, over the course of several days she was making some  progress and she ultimately desired to go home as her sister and son  reported that they would be able to assist her.  A home health physical  therapy nurse and aide have been arranged.  Home occupational therapy  has also been recommended.  In regards to her incisions, they have been  healing well without signs of infection.  Her foot appeared adequately  perfused.  She had postop ABIs on March 11 showing an increase in blood  flow with ABI of 0.72 on the left, however with some decrease in her ABI  on the right to 0.56.  On March 16 she was felt appropriate for  discharge home.  She is afebrile, vitals stable, oxygen saturation 96%  on room air.  Blood glucose levels were ranging around 140 to low 200s,   which it was felt she could have further monitoring on an outpatient  basis.  Her Lantus insulin was increased during her hospitalization.  Her most recent labs showed her hemoglobin A1c elevated at 0.2.  Sodium  136, potassium 4.9, chloride 110, CO2 20, blood glucose 185, BUN 16,  creatinine 1.04.  White blood count 10.3, hemoglobin 8, hematocrit 23.6,  platelet count 223.  On discharge she was voiding without difficulty,  tolerating food, and pain was controlled on oral medication.  Of note,  she did require 1 unit of packed red blood cells for acute blood loss  anemia during her hospitalization.   DISPOSITION:  [name] [name] was appropriate for discharge home on June 20, 2007.  Currently remains in stable condition.   DISCHARGE MEDICATIONS:  1. Metoprolol 100 mg p.o. b.i.d.  2. Amlodipine 10 mg p.o. daily.  3. Omeprazole 20 mg daily.  4. Lipitor 10 mg daily.  5. Ferrous sulfate 325 mg daily.  6. Citalopram  20 mg daily.  7. Lisinopril 5 mg daily.  8. Multivitamin daily.  9. Humalog sliding scale t.i.d. per home regimen.  10.Lantus insulin 30 units subcutaneously nightly.  11.Percocet 5/325 mg 1 tablet p.o. q.4 h. p.r.n. pain.   DISCHARGE INSTRUCTIONS:  She should a continue diabetic-appropriate  diet.  May shower and clean her incisions gently with soap and water.  She should call if she develops fever greater than 101 or redness or  drainage from her incision site or increased pain.  Should avoid driving  or heavy lifting for the next 4 weeks.  She will see Dr. early around 3-  4 weeks with ABIs.  Our office will contact her regarding a specific  appointment date and time.  In the meantime she will have home health  nursing, physical therapy occupational therapy and nursing aide.      Jerold Coombe, P.A.      [name], M.D.  Electronically Signed    AWZ/MEDQ  D:  06/20/2007  T:  06/20/2007  Job:  [id number]   cc:   Nadara Mustard, MD  Glynn Octave, MD   Shari Prows, MD

## 2010-08-19 NOTE — Procedures (Signed)
BYPASS GRAFT EVALUATION   INDICATION:  Follow up left femoral-to-popliteal bypass graft.   HISTORY:  Diabetes:  Yes.  Cardiac:  No.  Hypertension:  Yes.  Smoking:  Yes.  Previous Surgery:  Left superficial femoral artery to popliteal artery  bypass graft, 01/10/07.  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   SINGLE LEVEL ARTERIAL EXAM                               RIGHT              LEFT  Brachial:                    185                185  Anterior tibial:                                135  Posterior tibial:                               149  Peroneal:  Ankle/brachial index:        Below-knee amputation                  0.81   PREVIOUS ABI:  Date: 11/16/08  RIGHT:  BKA  LEFT:  0.81   LOWER EXTREMITY BYPASS GRAFT DUPLEX EXAM:   DUPLEX:  Patent left femoropopliteal bypass graft with elevated  velocities at the proximal anastomosis of 266 cm/s.   IMPRESSION:  1. Right below-knee amputation.  2. Stable ankle brachial indices from a previous study.  3. Patent left femoropopliteal bypass graft with elevated velocities,      as described above.   ___________________________________________  [name], M.D.   CB/MEDQ  D:  06/07/2009  T:  06/07/2009  Job:  [id number]

## 2010-08-19 NOTE — Op Note (Signed)
NAME:  LAURISSA, COWPER NO.:  [id number]   MEDICAL RECORD NO.:  [id number]          PATIENT TYPE:  INP   LOCATION:  5013                         FACILITY:  [facility]   PHYSICIAN:  Nadara Mustard, MD     DATE OF BIRTH:  22-Feb-1941   DATE OF PROCEDURE:  11/30/2007  DATE OF DISCHARGE:                               OPERATIVE REPORT   PREOPERATIVE DIAGNOSES:  Abscess, right foot with Wagner grade 3 ulcer  status post midfoot amputation.   POSTOPERATIVE DIAGNOSES:  Abscess, right foot with Wagner grade 3 ulcer  status post midfoot amputation.   PROCEDURE:  Right transtibial amputation.   SURGEON:  Nadara Mustard, MD   ANESTHESIA:  General.   ESTIMATED BLOOD LOSS:  Minimal.   ANTIBIOTICS:  1 g of Kefzol.   DRAINS:  None.   COMPLICATIONS:  None.   TOURNIQUET TIME:  Approximately 10 minutes of 300 mmHg to the thigh.   DISPOSITION:  To PACU in stable condition.   INDICATIONS FOR PROCEDURE:  The patient is a 70 year old woman with  peripheral vascular disease, diabetic insensate neuropathy who was  status post foot salvage surgery with the mid foot amputation.  The  patient has had progressive fevers, cellulitis, and a necrotic wound  over both the heel and the anterior aspect of the midfoot amputation.  The patient after failing conservative care with p.o. antibiotics  presents at this time for possible irrigation and debridement versus  transtibial amputation.  Risks and benefits were discussed including  infection, neurovascular injury, persistent pain, nonhealing of the  wound, and need for additional surgery. The patient states she  understands and wish to proceed at this time.   DESCRIPTION OF PROCEDURE:  The patient was brought to the OR room-1 and  underwent a general anesthetic.  After adequate level of anesthesia  obtained, the patient's right lower extremity was prepped using DuraPrep  and draped into a  sterile field.  An incision was made ellipsing  around  the forefoot wound and ulcer and this extended proximally.  There was an  area of purulent abscess that extended proximal to the ankle.  Due to  the extensive nature of the necrosis with the purulent abscess directly  apposed to the tibia, it was not felt that the patient would be able to  be felt salvaged for foot salvage surgery.  She also had a decubitus  heel ulcer with nonviable tissue on the heel and due to the heel ulcer,  forefoot ulcer and abscess, which extends proximal to the ankle, the  patient presents at this time for transtibial amputation.  The  instruments that were used for the initial irrigation and debridement  were passed off the table and new drape was applied.  The foot was  draped out into an impervious Ioban dressing.  With no other instruments  and the wound completely draped out of the field, a transtibial  amputation was made 10-cm distal to the tibial tubercle.  This occurred  proximally and a large posterior flap was created.  The tibia was  transected 1 cm proximal  to the skin incision with the anterior aspect  beveled.  The fibula was transected 1 cm proximal to the tibia incision.  A large amputation knife was used to create a posterior flap. The  vascular bundles were suture ligated x3 each with a 2-0 silk.  The  sciatic nerve was pulled, cut, and allowed to retract.  The tourniquet  was deflated after 10 minutes.  Hemostasis was obtained.  The deep and  superficial fascial layers were closed using #1 PDS.  The skin was  closed using proximate staples.  There was no tension on the skin.  The  wound was closed with no gaping wounds.  No tension on the skin.  The  wound was covered with Adaptic, orthopedic sponges,  ABD dressing,  Webril, and Coban.  The patient was then extubated and taken to PACU in  stable condition.      Nadara Mustard, MD  Electronically Signed     MVD/MEDQ  D:  11/30/2007  T:  12/01/2007  Job:  [id number]

## 2010-08-19 NOTE — Discharge Summary (Signed)
NAME:  [name] [name], [name] [name] NO.:  [id number]   MEDICAL RECORD NO.:  [id number]          PATIENT TYPE:  INP   LOCATION:  2023                         FACILITY:  [facility]   PHYSICIAN:  [name], M.D.    DATE OF BIRTH:  30-Aug-1940   DATE OF ADMISSION:  01/10/2007  DATE OF DISCHARGE:                               DISCHARGE SUMMARY   Anticipated discharge is going to be January 19, 2007.   ADMISSION DIAGNOSES:  1. Left foot ulceration.  2. Normocytic anemia.   DISCHARGE/SECONDARY DIAGNOSES:  1. Nonhealing left foot wound with ischemia status post left femoral      popliteal bypass and cyst left fifth toe Ray amputation.  2. Left fifth metatarsal and cuboid osteomyelitis.  3. Peripheral vascular occlusive disease.  4. Diabetes mellitus type 2.  5. Hypertension.  6. Hyperlipidemia.  7. History of left foot surgery in 2006.  8. No known drug allergies.  9. Normocytic anemia with a small component of acute blood loss anemia      requiring transfusion.   PROCEDURES:  1. January 10, 2007:  Aortogram, bilateral iliac arteriogram, bilateral      lower extremity runoff, selective left external iliac arteriogram      by Dr. Waverly Ferrari.  Results show no significant aortoiliac      occlusive disease, bilateral superficial femoral artery occlusive      disease at the adductor canal, tibial occlusive disease.  2. January 11, 2007:  Left superficial femoral artery to below-knee      popliteal artery bypass using reverse saphenous vein graft by Dr.      Gretta Began.  3. January 13, 2007:  Amputation left foot fifth Ray, partial      amputation of the cuboid, transfer of the adductor digiti quinti      muscle proximally by Dr. Aldean Baker.  4. January 12, 2007:  Postoperative ABI preliminary report shows 0.8 on      the right and 0.59 on the left.   CONSULTS:  1. Orthopedic, Dr. Aldean Baker  2. Occupational therapy  3. Physical therapy  4. Rehabilitation medicine   BRIEF HISTORY:  [name] [name] is a 70 year old Caucasian female with two-  month history of progressive tissue loss in the left lateral foot.  She  has been followed extensively by Dr. Javier Glazier at a wound center  and has had progressive tissue loss despite antibiotics and local wound  care.  She was referred to Dr. Tawanna Cooler Early for further evaluation and  lower extremity arterial Doppler studies confirmed severe left lower  extremity arterial insufficiency.  Dr. Arbie Cookey recommended that she be  admitted for lower extremity arteriogram and probable left lower  extremity bypass during the same admission.   HOSPITAL COURSE:  [name] [name] was electively admitted to [facility] on January 10, 2007.  She underwent bilateral lower extremity  arteriogram with results as discussed above.  Based on these results,  Dr. Arbie Cookey felt she should proceed with left superficial femoral artery  to popliteal artery bypass.  This was done on January 11, 2007.  Her  postoperative ABI showed improvement.  Of note, prior to her procedure,  she was noted to have anemia, a hemoglobin of 6.6.  That was down from  her admission hemoglobin of 7.5.  She did receive one unit of packed red  blood cells prior to having her femoral popliteal artery bypass.  Following surgery, she was transferred to stepdown unit 3300 for the  first 24 hours.  She was stable overnight and was seen by Dr. Aldean Baker who recommended that she undergo a left fifth toe Ray amputation  with muscle transfer during this same admission.  This was scheduled and  done on January 13, 2007.  By this time, [name] [name] had been transferred  to telemetry 2000 where it is anticipated she will remain until  discharge.  During her hospitalization, [name] [name] remained on  Augmentin.  Nitroglycerin patch was also prescribed by Dr. Lajoyce Corners to her  left ankle to improve blood supply.  By discharge, nursing staff was  performing dry dressing changes to her left  foot.  There was a moderate  amount of serosanguineous drainage at times, but overall the site felt  to appear stable without signs of infection.  She had peroneal dorsalis  pedis and posterior tibial  Doppler signals in the left foot.  Dr. Lajoyce Corners  did request the patient be non-weightbearing to her left foot.  She was  seen by physical therapy and occupational therapy who initially felt she  would benefit from rehab or short-term skilled nursing home placement.  However, the patient was quite motivated and after following her for  several days, they ultimately felt that she should do okay at home with  home health physical therapy, occupational therapy, home health aide and  nurse.  Rehab medicine also saw her for initial consult and agreed with  this assessment.  Prior to discharge, we did order for assistance with  arranging a lift chair for home.  She reported that she already had  other equipment available to her.  On January 18, 2007, [name] [name] was  felt nearing readiness for discharge.  She was making progress with  mobility.  Her left foot incision remained stable without sign of  infection.  Her leg incision remained clean and dry without sign of  infection.  Vitals were stable showing a blood pressure of 119/58, heart  rate in the 60s, overall she was afebrile and oxygen saturations were  98% on room air.  Her last hemoglobin was improved at 9.0.  Her pain was  controlled on oral medication.  She was voiding; her bowel function was  returning after assistance with a laxative.  Blood sugars were managed  on her home regimen as well as NovoLog insulin sliding scale.  Her  hemoglobin A1c was slightly elevated at 7.6 on admission.  At this  point, we anticipate that as long as [name] [name] continues to improve in  his manner, there are no significant changes in her status, that she  will be ready for discharge home on January 19, 2007.  Currently, [name] remains in stable  condition.   RECENT LABS:  Most recent labs this admission showed a hemoglobin A1c of  7.6, sodium 135, potassium 4.8, chloride 107, CO2 19, blood glucose 110,  BUN 9, creatinine 0.99, calcium 8.6, white blood count at 13.7,  hemoglobin 9, hematocrit 27.1, platelet count 287.   Of note, her admission CBC showed a hemoglobin of 7.5, hematocrit of  23.4, MCV  at 85.1, MCHC of 31.9, RDW of 19.34m, platelet count of 341.   DISCHARGE MEDICATIONS:  1. Metoprolol 100 mg p.o. b.i.d.  2. Amlodipine 10 mg p.o. daily.  3. Omeprazole 20 mg daily.  4. Glimepiride 4 mg b.i.d.  5. Lipitor 10 mg daily.  6. Ferrous sulfate 325 p.o. b.i.d.  7. Citalopram 20 mg p.o. daily.  8. Lantus insulin 30 units subcutaneously q.h.s.  9. Lisinopril 10 mg p.o. daily.  10.Calcium 600 mg two tablets daily.  11.Vitamin E supplement 400 IU daily.  12.Folic acid 800 mcg daily.  13.Centrum Silver daily.  14.Fish oil capsule daily.  15.Patient was also on Arginine eight tablets daily per her home      regimen and Borage oil 500 mg daily.  16.She has also been on Augmentin 875/125 mg b.i.d.; she continues to      be on this, but we anticipate that this will be discontinued at      discharge unless otherwise specified by Dr. Lajoyce Corners.  17.Nitroglycerin patch 0.2 mg transdermal daily.  18.Percocet 5/325 mg one to two tablets p.o. q.4 hours p.r.n. pain.   DISCHARGE INSTRUCTIONS:  1. She is to continue diabetic-appropriate diet.  2. She may shower and clean her incisions gently with soap and water.  3. She should apply a dry gauze dressing to her left foot daily and as      needed.  4. Home health nurse will assist with dressing changes and teaching.  5. She will increase activities slowly.  6. Avoid driving until further notice.  7. She is to be non-weightbearing to her left foot.  8. She may be out of bed with walker assist.  9. Physical therapy and occupational therapy will also continue to      follow her at home.   10.She should call if she develops fever greater than 101, redness or      drainage from her incision sites or increased pain.  11.Dr. Early will see her in approximately three weeks with Doppler      studies, and she is to call [phone] to schedule two-week followup      appointment with Dr. Aldean Baker.      Jerold Coombe, P.A.      [name], M.D.  Electronically Signed    AWZ/MEDQ  D:  01/18/2007  T:  01/18/2007  Job:  [id number]   cc:   Nadara Mustard, MD  Shari Prows, MD  Javier Glazier, MD

## 2010-08-19 NOTE — Op Note (Signed)
NAME:  [name] [name], [name] [name] NO.:  [id number]   MEDICAL RECORD NO.:  [id number]          PATIENT TYPE:  INP   LOCATION:  2622                         FACILITY:  [facility]   PHYSICIAN:  [name], M.D.    DATE OF BIRTH:  05-27-1940   DATE OF PROCEDURE:  06/14/2007  DATE OF DISCHARGE:                               OPERATIVE REPORT   PREOPERATIVE DIAGNOSIS:  Ischemic right foot with gangrene of right  fifth toe.   POSTOPERATIVE DIAGNOSIS:  Ischemic right foot with gangrene of right  fifth toe.   PROCEDURE:  1. Right distal superficial femoral artery to below knee popliteal      bypass with reversed saphenous vein.  2. Right fifth toe amputation.   SURGEON:  [name], M.D.   ASSISTANT:  Cyndy Freeze, PA   ANESTHESIA:  General endotracheal.   COMPLICATIONS:  None.   DISPOSITION:  To recovery room stable.   DESCRIPTION OF PROCEDURE:  The patient was taken to the operating room  and placed in the supine position where the area of the right left and  right groin were prepped and draped in the usual sterile fashion.  Incision was made over the saphenous vein in the mid thigh.  The vein  was quite superficial, was moderate size at this level.  The vein was  then mobilized further distally down to the level of the knee and  branched into multiple different branches, all unusable for bypass.  The  superficial femoral artery was exposed through the same incision.  Preoperative arteriogram had shown the stenosis at the adductor canal.  The artery was exposed further proximally above the adductor canal and  was of good caliber with mild calcification.  The below knee popliteal  artery was exposed through the same vein harvest incision and was of  good caliber.  The vein was unroofed through several separate smaller  vein harvest incisions up to the proximal thigh and was of good caliber.  Tributary branches were ligated with 3-0 and 4-0 silk ties and divided.  The vein was gently dilated and was adequate for bypass.  The patient  was given 8000 units of intravenous heparin and after adequate  circulation time, the below knee popliteal artery was occluded  proximally and distally and was opened with an 11 blade and extended  longitudinally with Potts scissors.  The vein was reversed, was  spatulated, and sewn end-to-side to the below knee popliteal artery with  a running 6-0 Prolene suture.  Anastomosis was tested and found to be  adequate.  The vein was then brought through a prior created tunnel  anatomically to the level of the distal superficial femoral artery.  Superficial femoral artery was occluded proximally and distally and was  opened with 11 blade and extended longitudinally with Potts scissors.  The vein was cut to appropriate length, spatulated, and sewn end-to-side  to the artery with a running 6-0 Prolene suture.  Prior to completion of  the anastomosis, the usual flushing maneuvers were undertaken.  The  anastomosis was completed and flow was restored to the  lower extremity.  Excellent graft dependent Doppler flow was noted at the foot.  The  patient was given 50 mg of Protamine to reverse the heparin.  The wounds  were irrigated with saline, hemostasis with electrocautery.  Wounds were  closed with 2-0 Vicryl in the subcutaneous tissue and the skin was  closed with 3-0 subcuticular Vicryl stitch.   Next, attention was turned to the right foot.  The incision was made at  the base of the right fifth toe.  This was carried down to leave the  metatarsal head intact.  Bone was divided with rongeurs proximal to  where the skin incision was.  The wounds were irrigated with saline and  there was good bleeding.  This was controlled with electrocautery.  The  wound in the toe was closed with interrupted 3-0 nylon sutures.  Sterile  dressing was applied.  The patient was taken to the recovery room in  stable condition.      [name], M.D.  Electronically Signed     TFE/MEDQ  D:  06/14/2007  T:  06/15/2007  Job:  [id number]

## 2010-08-19 NOTE — Op Note (Signed)
NAME:  [name] [name], [name] [name] NO.:  [id number]   MEDICAL RECORD NO.:  [id number]          PATIENT TYPE:  AMB   LOCATION:  SDS                          FACILITY:  [facility]   PHYSICIAN:  VDurene Cal IV, MDDATE OF BIRTH:  [birth year]   DATE OF PROCEDURE:  06/09/2007  DATE OF DISCHARGE:                               OPERATIVE REPORT   PREOPERATIVE DIAGNOSIS:  Right lower extremity ulcer.   POSTOPERATIVE DIAGNOSIS:  Right lower extremity ulcer.   PROCEDURES PERFORMED:  1. Ultrasound-guided left common femoral arteryaccess.  2. Abdominal arteriogram.  3. Second order catheterization.  4. Right lower extremity runoff.   INDICATION:  This is 70 year old white female who has a history of  atibial bypass on the left for ulcer disease.  She now has a new ulcer  on her right leg with a decrease in her ankle-brachial indices.  She  comes in for arteriogram with a possibility of an intervention.  The  risks and benefits were discussed.  Informed consent was signed.   PROCEDURE:  The patient identified in the holding area and taken to  room.  She was placed supine on the table.  Bilateral groins were  prepped and draped in standard sterile fashion.  Time-out was called.  Using ultrasound, the left common femoral artery was evaluated and found  to be widely patent.  Lidocaine 1% was used with local anesthesia.  Using ultrasound guidance, left common femoral artery was accessed with  an 18-guage needle.  An 0.035 Bentson wire was advanced in retrograde  fashion into the abdominal aorta on fluoroscopic visualization.  Next, a  5-French sheath sheet was placed.  Omniflush catheter was placed at the  level L1 and an abdominal arteriogram was obtained.  Next, catheter was  pulled down the aorta bifurcation and a pelvic angiogram was obtained.  Using the Omniflush catheter and a Bentson wire, the aorta bifurcation  was crossed, catheter was placed on the right external iliac artery  and  right lower extremity runoff was obtained.   FINDINGS:  Arteriogram:  Visualized portions of suprarenal abdominal  aorta showed minimal disease.  There are single renal arteries  bilaterally without evidence of significant stenosis.  The infrarenal  abdominal aorta is widely patent with no areas of hemodynamically  significant stenoses.  Bilateral common iliac arteries are widely  patent.  Bilateral hypogastric arteries are widely patent.  Bilateral  external iliac arteries are widely patent.   Right Lower Extremity:  The right common femoral artery is patent with  minimal disease.  The profunda femoral artery is patent.  The right  superficial femoral artery is widely patent down to the level of the  adductor canal at which time significant disease is identified.  As the  artery crosses the bone edge, there is a high grade stenosis with  occlusion in the popliteal artery just proximal to the patella.  The  popliteal artery just distal to this is patent.  The main runoff to the  foot is the peroneal artery.  The anterior tibial artery is occluded  proximally as is the posterior tibial  artery.  There is reconstitution  of the posterior tibial artery at the level of the ankle via the  peroneal artery.  Dorsalis pedis artery is prominent at the level of the  ankle.   After the above images were obtained, decision was made not intervene at  this time.  The patient will be brought back for possible percutaneous  intervention versus operative intervention.  The catheters and wires  were removed and the sheath was removed in the room.  The patient  tolerated the procedure well.  There are no complications.   IMPRESSION:  1. No evidence of aortoiliac disease.  2. Right SFA/popliteal occlusion.  3. Single vessel runoff via the peroneal artery with reconstitution of      the dorsalis pedis and posterior tibial at the ankle.           ______________________________  V. Charlena Cross, MD  Electronically Signed     VWB/MEDQ  D:  06/09/2007  T:  06/09/2007  Job:  [id number]

## 2010-08-19 NOTE — Procedures (Signed)
BYPASS GRAFT EVALUATION   INDICATION:  Follow-up evaluation of left leg bypass graft.   HISTORY:  Diabetes:  Yes.  Cardiac:  No.  Hypertension:  Yes.  Smoking:  Rarely smokes.  Previous Surgery:  Left superficial femoral artery-to-popliteal artery  bypass graft on January 10, 2007.  Right superficial femoral artery-to-  below-knee pop bypass graft by Dr. Arbie Cookey.  Right below-knee amputation  September, 2009.   SINGLE LEVEL ARTERIAL EXAM                               RIGHT              LEFT  Brachial:                    158                156  Anterior tibial:             Amputated          155  Posterior tibial:            Amputated          142  Peroneal:  Ankle/brachial index:        N/A                0.98   PREVIOUS ABI:  Date: 11/18/2007  RIGHT:  0.71  LEFT:  0.81   LOWER EXTREMITY BYPASS GRAFT DUPLEX EXAM:   DUPLEX:  Doppler arterial waveforms are monophasic proximal to and  biphasic within and distal to the left superficial femoral-to-below-knee  pop bypass graft.  There is an elevated peak systolic velocity in the  proximal anastomosis of 453 cm/s which stable compared to previous  study.   IMPRESSION:  1. Left ABI is stable from previous study.  2. Patent left above-knee and below-knee pop bypass graft.  3. Elevated peak systolic velocity in the proximal anastomosis (453      cm/s) which is stable compared to previous study.  4. Dr. Arbie Cookey was notified of preliminary results.   ___________________________________________  [name], M.D.   MC/MEDQ  D:  02/17/2008  T:  02/17/2008  Job:  [id number]

## 2010-08-19 NOTE — Op Note (Signed)
NAME:  [name] [name], [name] [name] NO.:  [id number]   MEDICAL RECORD NO.:  [id number]          PATIENT TYPE:  AMB   LOCATION:  SDS                          FACILITY:  [facility]   PHYSICIAN:  Nadara Mustard, MD     DATE OF BIRTH:  12-14-1940   DATE OF PROCEDURE:  03/25/2007  DATE OF DISCHARGE:                               OPERATIVE REPORT   PREOPERATIVE DIAGNOSIS:  Chronic ulcer, left foot, status post  revascularization and status post foot salvage surgery.   POSTOPERATIVE DIAGNOSIS:  Chronic ulcer, left foot, status post  revascularization and status post foot salvage surgery.   PROCEDURE:  Irrigation and debridement of wound, lateral aspect left  foot, and application of 1 unit of Apligraf.   SURGEON:  Nadara Mustard, M.D.   ANESTHESIA:  None.   DRAINS:  None.   COMPLICATIONS:  None.   DISPOSITION:  To home in stable condition.   INDICATIONS FOR THE PROCEDURE:  The patient is a 70 year old woman  status post revascularizations of the left lower extremity with type 2  diabetes.  She has also undergone a fifth ray amputation and still has a  chronic ulcer on the face of the foot after failure of conservative  care.  The patient presents at this time for application of Apligraf.  Risks and benefits of surgery were discussed.  The patient states he  understands and wishes to proceed at this time.   DESCRIPTION OF PROCEDURE:  The patient was brought to short-stay, and  the left lower extremity was cleansed using normal saline.  The skin and  soft tissue was debrided with a 15 blade knife.  There was good bleeding  granulation tissue at the base of the wound.  The Apligraf was meshed  and applied to the wound.  This was then covered with Mepitel plus  Bactroban plus 4x4s plus Kerlix plus Coban.  Plan to follow-up in the  office this next Tuesday.      Nadara Mustard, MD  Electronically Signed     MVD/MEDQ  D:  03/25/2007  T:  03/26/2007  Job:  [id number]

## 2010-08-19 NOTE — Procedures (Signed)
LOWER EXTREMITY ARTERIAL EVALUATION-SINGLE LEVEL   INDICATION:  Followup, peripheral vascular disease.   HISTORY:  Diabetes:  Yes.  Cardiac:  No.  Hypertension:  Yes.  Smoking:  No.  Previous Surgery:  Left superficial femoral artery to below-knee  popliteal artery bypass graft with reverse saphenous vein on 01/10/07 by  Dr. Arbie Cookey.   RESTING SYSTOLIC PRESSURES: (ABI)                          RIGHT                LEFT  Brachial:               150                  158  Anterior tibial:        28 (0.17)            120  Posterior tibial:       20                   134 (0.85)  Peroneal:  DOPPLER WAVEFORM ANALYSIS:  Anterior tibial:        Barely pulsatile     Monophasic  Posterior tibial:       Barely pulsatile     Biphasic  Peroneal:   PREVIOUS ABI'S:  Date: 05/16/07  RIGHT:  0.23  LEFT:  0.78   IMPRESSION:  1. Right ankle brachial index is decreased from previous examination.  2. Left ankle brachial index is increased from previous examination.   ___________________________________________  [name], M.D.   DP/MEDQ  D:  06/08/2007  T:  06/08/2007  Job:  [id number]

## 2010-08-19 NOTE — Assessment & Plan Note (Signed)
OFFICE VISIT   [name] [name], [name] [name]  DOB:  11-16-1940                                       03/18/2007  CHART#:[id number]   The patient presents today for continued followup of her left SFA and  popliteal bypass for limb salvage.  Her foot looks quite good today.  She is to see Dr. Lajoyce Corners later on today for further followup.  She has a  bounding 3+ popliteal graft pulse.  I am quite pleased with her  followup.  She will continue to see Dr. Lajoyce Corners for foot care, and I will  see her at her next protocol followup as well.   [name], M.D.  Electronically Signed   TFE/MEDQ  D:  03/18/2007  T:  03/21/2007  Job:  [id number]   cc:   Nadara Mustard, MD  Dr. Glynn Octave

## 2010-08-19 NOTE — Assessment & Plan Note (Signed)
OFFICE VISIT   [name] [name], [name] [name]  DOB:  May 13, 1940                                       11/18/2007  IONGE#:[id number]   Patient presents today for followup of her staged bilateral  infrainguinal bypasses.  She has a transmetatarsal amputation on her  right foot from Dr. Aldean Baker, and he has continued to follow her with  wound healing of this.  She did not take down her dressing today, but  she reports that Dr. Lajoyce Corners is pleased with her progress.  She is status  post left superficial femoral artery to popliteal bypass in October,  2008 and right superficial femoral artery to popliteal bypass in March,  2009.  Her feet are well perfused bilaterally.  Her ankle-arm index has  actually improved to 0.71 on the right and 0.81 on the left.   Duplex imaging of her grafts do show elevated velocities bilaterally.   I discussed this at length with patient and her family present.  I feel  comfortable with observation only at this time.  I did explain the  option of arteriography, but with her open wounds and with good  perfusion bilaterally, I feel that we should re-image this in three  months for followup.  She will notify us should she have any clinical  change.   [name], M.D.  Electronically Signed   TFE/MEDQ  D:  11/18/2007  T:  11/18/2007  Job:  [id number]   cc:   Nadara Mustard, MD

## 2010-08-22 NOTE — Discharge Summary (Signed)
NAME:  [name], [name] NO.:  [id number]   MEDICAL RECORD NO.:  [id number]          PATIENT TYPE:  IPS   LOCATION:  4010                         FACILITY:  [facility]   PHYSICIAN:  Ranelle Oyster, M.D.DATE OF BIRTH:  July 13, 1940   DATE OF ADMISSION:  12/02/2007  DATE OF DISCHARGE:  12/16/2007                               DISCHARGE SUMMARY   DISCHARGE DIAGNOSES:  1. 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  2. Diabetes mellitus type 2.  3. Hypertension.  4. Hypokalemia, resolved.  5. History of depression.  6. Anemia.   HISTORY OF PRESENT ILLNESS:  [name] [name] is a 70 year old female with a  history of diabetes mellitus, hypertension, right great toe with  gangrenous changes, and osteomyelitis.  She underwent right transmet  amputation in July 2009 with attempts at limb salvage.  As the patient  continued with infection with abscess formation on left foot, she  required a right transtibial amputation on November 30, 2007, by Dr. Lajoyce Corners.  Postop physical therapy has been ongoing and the patient is noted to  have impairments in mobility and self-care.  A rehab was consulted for  progressive therapies.   Past medical history is significant for:  1. 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  2. 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  3. Hypertension.  4. Dyslipidemia.  5. Depression.  6. Peripheral vascular disease.  7. Left fem-pop bypass graft in October 2008.  8. History of peptic ulcer disease.  9. Right toe amp in July 2009 and right transmet amp in July 2009.  10.__________ bilateral eyes for retinopathy.  11.A left fifth toe amputation in January 2008 with subsequent I&D in      December 2008.   ALLERGIES:  No known drug allergies.   FAMILY HISTORY:  Positive for heart valve problems.   SOCIAL HISTORY:  The patient is married, lives  with husband in 1-level  home with no steps at entry.  They have hired health 4 or 5 times a week  with friends and family assisting as needed.  Husband is an amputee.  The patient does not use any alcohol.  Uses 2-3 cigarettes a day.   FUNCTIONING HISTORY:  The patient was independent for transfers.  Limited ambulation since June 2009 due to nonweightbearing status on  right lower extremity.  Used a walker and wheelchair prior to admission.   FUNCTIONAL STATUS:  The patient is max assist 40% for sit to stand, min  assist with food at the end of the bed, and stand on left leg.   PHYSICAL EXAMINATION:  GENERAL:  The patient is alert and oriented  female in no acute distress.  HEENT:  Pupils equal, round, and reactive to light.  Ear, nose, and  throat exam is unremarkable.  Dentition is poor with 4 missing teeth.  NECK:  Supple without JVD or lymphadenopathy.  CHEST:  Lungs showed occasional rhonchi.  No wheezes.  HEART:  Regular rate and rhythm with soft  systolic murmur.  EXTREMITIES:  No clubbing, cyanosis, or edema.  Right lower right  extremity BKA site is wrapped with postop compressive dressing.  Left  foot had some dryness on lateral aspect.  ABDOMEN:  Soft and nontender with positive bowel sounds.  SKIN:  Intact.  NEUROLOGIC:  Cranial nerves II through XII reveal decreased visual  acuity, otherwise, unremarkable.  Reflexes are 1+.  Sensation is  decreased distally.  Strength is generally 4-5/5 on upper extremity.  She is able to lift right lower extremity briefly against gravity.  Left  lower extremity is 2+-3/5 proximally, 3/5 distally with some pain behind  the knee with lifting.  Judgment and orientation are normal.  Memory is  intact.  Mood is flat.   HOSPITAL COURSE:  [name] was admitted to rehab on December 02, 2007, for inpatient therapies that consisted of PT and OT at least 3  hours 5 days a week.  Physiatrist, rehab RN, and therapy team have  worked  together to provide customized collaborative interdisciplinary  care with team conferences that were held on weekly basis to assess the  patient's progress, set goals, and problem solve barriers to discharge.  The patient's blood pressures were monitored on b.i.d. basis during the  stay.  These were noted to be labile, the systolics ranging in 150  initially.  Secondary to issues with hyperkalemia as well as elevated  blood pressure, she was started on low-dose hydrochlorothiazide with  close monitoring of renal status while on this.  The patient's blood  pressures by the time of discharge are noted to be improved ranging in  130s systolics and 50s to 60s diastolic.  Heart rate is stable in mid-to-  high 50s.  The patient's mood has been followed along.  The patient has  been motivated without any signs of depression during the stay.  The  patient's blood sugars were monitored with a.c. and at bedtime CBG  checks.  She was started on meal coverage NovoLog and Lantus insulin  were slowly titrated upward for better CBG control.  A trial of  Glucotrol was initiated; however, the patient was noted to have issue  with hypoglycemia on this; therefore, this was discontinued.  Blood  sugars at the time of discharge ranging from 100 to high and low 200  once a day.  The patient is encouraged to continue checking CBGs for  now.  She will continue with meal coverage as well as sliding scale  insulin and will follow up with primary MD regarding further adjustments  in her diabetic regimen.  The patient's renal status has been monitored  along.  She was noted to have some hyponatremia at admission with sodium  at 133.  Additionally, the patient was noted to have some elevation in  potassium to 5.2 on December 13, 2007, check of labs.  With  hydrochlorothiazide initiation, her hypokalemia has resolved.  Her renal  status have also to be stable.  Last check of lytes of December 16, 2007, revealed  sodium 138, potassium 4.9, chloride 106, CO2 27, BUN 19,  creatinine 0.9, and triglycerides 138.  A UA/UC was checked past  admission and this showed no growth.  The patient was noted to have  acute blood loss anemia with some leukocytosis at admission with white  count at 10.3.  Recheck CBC on December 13, 2007, showed hemoglobin 9.9,  hematocrit 29.6, white count 12.2,  platelets 284.  The patient was  maintained on iron  supplements for anemia.   Rehab RN has been following along for skin care as well as wound care  management and bowel and bladder training to help with constipation  issues.  The patient has been told to help maintain continence.  Pain  management has also provided with RN working with therapy team to  premedicate the patient to help her participation in therapies.  The  patient was maintained with surgical dressing through December 08, 2007.  At this time, dressing was discontinued and small areas of drainage were  noted around the incision line.  The patient did develop some  serosanguineous drainage around wound incision.  This had resolved by  the time of discharge.  The patient remained intact at the time of  discharge with the patient to continue with dry dressing.  A prosthetic  consult was initiated on December 08, 2007, with some education as well  as input regarding further need for prosthetics.  Also with resolution  of drainage with lightweight compressive shrinker was ordered and fitted  for the patient on December 16, 2007, prior to discharge.  The patient  was noted to have some Candida-type irritation around skin folds into  the breasts and nursing has been following along with Micro-Guard powder  as well as EPC cream for treatment of some irritation around sacral  area.  Therapy has been working with the patient with the patient having  made excellent progress during her stay.  At the time of admission, the  patient was at max assist for transfers,  unable to stand with just one  assist.  Required mod assist for toileting with total assist safety for  bedside commode use.  With OT, the patient has progressed along to being  in supervision overall.  Pain control has improved pain.  The patient  has had no complaints of pain.  OT has worked with endurance as well as  squat pivot transfers to bedside commode focusing on protecting skin  integrity.  Prior to discharge, family aid was completed with husband  and the patient's friend with the patient showing ability to transfer  safely and understanding need for squat pivot transfers when her left  leg is tired, at which time she is to use squat pivot transfers until  stronger.  Family was educated on the importance of grab bars around  toilet for clothing manipulation and they will look into installing  this.  Initially with physical therapy, she was max assist for transfer.  She was at supervision to min assist for wheelchair mobility.  By the  time of discharge, the patient was at supervision for transfers, close  supervision for standing 5 minutes with 1-2 upper extremity support.  She was able to perform basic transfers with supervision without cues,  car transfers with min assist, and exiting car with supervision and  entering car required increased time for mobility.  The patient was  independent for wheelchair mobility greater than 300 feet on unit.  Upper extremity and lower extremity group have focused on lower  extremity exercises for strengthening and endurance.  Family education  was completed with safety emphasized, especially when the patient is  fatigued.  The patient and husband have completed all functional  mobility training, and the patient will have supervision past discharge.  The patient will continue with further followup home health PT by  [facility] [place].  Home health RN has been arranged  for wound monitoring and will follow up with the  patient past discharge.  On December 16, 2007, the patient is discharged to home.   DISCHARGED MEDICATIONS:  1. Norvasc 10 mg per day.  2. Hydrochlorothiazide 12.5 mg a day.  3. Celexa 20 mg a day.  4. Lyrica 75 mg b.i.d.  5. Lopressor 100 mg b.i.d.  6. Vitamin B6 100 mg b.i.d.  7. Ferrous sulfate 325 mg a day.  8. Lipitor 10 mg nightly.  9. Lantus insulin 40 units q. Supper.  10.OxyIR 5 mg 1-2 q.6-8 h. p.o. p.r.n. pain, #45 Rx.  11.Omeprazole 20 mg per day in a.m.  12.Humalog 5 units with meals t.i.d. and sliding scale insulin      according to home regimen.   Diet is diabetic diet.   WOUND CARE:  Wash area with antibacterial soap and water and keep clean  and dry, then apply stump shrinker.   ACTIVITIES:  A 24-hour supervision.  No strenuous activity.  No alcohol,  no smoking, no driving.   SPECIAL INSTRUCTIONS:  [facility] to provide PT, RN, and  CNA.  Do not use hydrocodone or lisinopril.   FOLLOWUP:  The patient to follow up with Dr. Riley Kill on January 31, 2008,  at 10:40, follow up with Dr. Lady Gary in 2-3 weeks, follow up with Dr.  Lajoyce Corners for postop check in the next 7-10 days.      Greg Cutter, P.A.      Ranelle Oyster, M.D.  Electronically Signed    PP/MEDQ  D:  12/20/2007  T:  12/21/2007  Job:  [id number]   cc:   Ricky Stabs, MD  Nadara Mustard, MD

## 2010-12-29 LAB — TYPE AND SCREEN
ABO/RH(D): O POS
Antibody Screen: NEGATIVE

## 2010-12-29 LAB — BASIC METABOLIC PANEL
BUN: 16
CO2: 21
CO2: 22
Calcium: 8.1 — ABNORMAL LOW
Calcium: 8.9
Chloride: 107
Chloride: 111
Creatinine, Ser: 1.04
GFR calc Af Amer: >60
GFR calc non Af Amer: 53 — ABNORMAL LOW
GFR calc non Af Amer: 54 — ABNORMAL LOW
Glucose, Bld: 144 — ABNORMAL HIGH
Potassium: 4.9
Sodium: 136
Sodium: 139

## 2010-12-29 LAB — BLOOD GAS, ARTERIAL
Bicarbonate: 19.1 — ABNORMAL LOW
O2 Saturation: 96.1
Patient temperature: 98.6
pCO2 arterial: 32.3 — ABNORMAL LOW
pH, Arterial: 7.39
pO2, Arterial: 78 — ABNORMAL LOW

## 2010-12-29 LAB — CBC
HCT: 23.6 — ABNORMAL LOW
Hemoglobin: 7.2 — CL
Hemoglobin: 8 — ABNORMAL LOW
MCHC: 33.2
MCHC: 33.6
MCV: 88.6
MCV: 89.1
Platelets: 206
Platelets: 211
RBC: 2.67 — ABNORMAL LOW
RBC: 2.79 — ABNORMAL LOW
RBC: 3.41 — ABNORMAL LOW
RDW: 16.4 — ABNORMAL HIGH
RDW: 17 — ABNORMAL HIGH
RDW: 17.2 — ABNORMAL HIGH
RDW: 17.5 — ABNORMAL HIGH
WBC: 10.3
WBC: 8.3

## 2010-12-29 LAB — DIFFERENTIAL
Eosinophils Relative: 2
Lymphs Abs: 1.6
Monocytes Relative: 9

## 2010-12-29 LAB — URINALYSIS, ROUTINE W REFLEX MICROSCOPIC
Bilirubin Urine: NEGATIVE
Ketones, ur: NEGATIVE
Nitrite: NEGATIVE
Protein, ur: 30 — AB

## 2010-12-29 LAB — COMPREHENSIVE METABOLIC PANEL
Alkaline Phosphatase: 68
BUN: 23
CO2: 20
Glucose, Bld: 229 — ABNORMAL HIGH
Sodium: 136
Total Bilirubin: 0.4

## 2010-12-29 LAB — URINE MICROSCOPIC-ADD ON

## 2010-12-29 LAB — CROSSMATCH

## 2011-01-01 LAB — BASIC METABOLIC PANEL
CO2: 24
Chloride: 103
GFR calc Af Amer: >60
Potassium: 4.9
Sodium: 137

## 2011-01-01 LAB — CBC
HCT: 33.7 — ABNORMAL LOW
Hemoglobin: 10.8 — ABNORMAL LOW
MCHC: 32.2
MCV: 80.3
RBC: 4.2
WBC: 11 — ABNORMAL HIGH

## 2011-01-07 LAB — GLUCOSE, CAPILLARY
Glucose-Capillary: 105 — ABNORMAL HIGH
Glucose-Capillary: 112 — ABNORMAL HIGH
Glucose-Capillary: 119 — ABNORMAL HIGH
Glucose-Capillary: 124 — ABNORMAL HIGH
Glucose-Capillary: 125 — ABNORMAL HIGH
Glucose-Capillary: 129 — ABNORMAL HIGH
Glucose-Capillary: 129 — ABNORMAL HIGH
Glucose-Capillary: 134 — ABNORMAL HIGH
Glucose-Capillary: 142 — ABNORMAL HIGH
Glucose-Capillary: 145 — ABNORMAL HIGH
Glucose-Capillary: 150 — ABNORMAL HIGH
Glucose-Capillary: 161 — ABNORMAL HIGH
Glucose-Capillary: 169 — ABNORMAL HIGH
Glucose-Capillary: 170 — ABNORMAL HIGH
Glucose-Capillary: 177 — ABNORMAL HIGH
Glucose-Capillary: 179 — ABNORMAL HIGH
Glucose-Capillary: 184 — ABNORMAL HIGH
Glucose-Capillary: 214 — ABNORMAL HIGH
Glucose-Capillary: 220 — ABNORMAL HIGH
Glucose-Capillary: 223 — ABNORMAL HIGH
Glucose-Capillary: 242 — ABNORMAL HIGH
Glucose-Capillary: 255 — ABNORMAL HIGH
Glucose-Capillary: 52 — ABNORMAL LOW
Glucose-Capillary: 74
Glucose-Capillary: 82

## 2011-01-07 LAB — BASIC METABOLIC PANEL
BUN: 14
BUN: 19
CO2: 26
Calcium: 8.6
Calcium: 8.7
Calcium: 8.9
Chloride: 105
Creatinine, Ser: 0.93
Creatinine, Ser: 0.95
GFR calc Af Amer: >60
GFR calc non Af Amer: 59 — ABNORMAL LOW
GFR calc non Af Amer: >60
Glucose, Bld: 138 — ABNORMAL HIGH
Sodium: 137
Sodium: 138

## 2011-01-07 LAB — CBC
Platelets: 284
WBC: 12.2 — ABNORMAL HIGH

## 2011-01-13 ENCOUNTER — Encounter (INDEPENDENT_AMBULATORY_CARE_PROVIDER_SITE_OTHER): Payer: Medicare Other | Admitting: *Deleted

## 2011-01-13 DIAGNOSIS — I739 Peripheral vascular disease, unspecified: Secondary | ICD-10-CM

## 2011-01-13 DIAGNOSIS — Z48812 Encounter for surgical aftercare following surgery on the circulatory system: Secondary | ICD-10-CM

## 2011-01-15 LAB — CBC
HCT: 23.4 — ABNORMAL LOW
MCHC: 31.9
MCHC: 32.3
MCV: 84.6
MCV: 84.9
MCV: 85.1
Platelets: 281
Platelets: 287
Platelets: 341
RBC: 3.21 — ABNORMAL LOW
RDW: 19.6 — ABNORMAL HIGH
RDW: 19.7 — ABNORMAL HIGH
WBC: 13.7 — ABNORMAL HIGH

## 2011-01-15 LAB — CROSSMATCH

## 2011-01-15 LAB — POCT I-STAT 4, (NA,K, GLUC, HGB,HCT)
Glucose, Bld: 102 — ABNORMAL HIGH
HCT: 22 — ABNORMAL LOW
Hemoglobin: 7.5 — CL
Operator id: 156951
Potassium: 4.3
Sodium: 136
Sodium: 136

## 2011-01-15 LAB — BASIC METABOLIC PANEL
BUN: 11
BUN: 16
BUN: 9
CO2: 19
CO2: 20
Calcium: 8.6
Calcium: 8.7
Chloride: 107
Chloride: 110
Chloride: 112
Creatinine, Ser: 0.9
Creatinine, Ser: 0.96
Creatinine, Ser: 0.99
GFR calc Af Amer: >60
GFR calc Af Amer: >60
GFR calc non Af Amer: 56 — ABNORMAL LOW
Glucose, Bld: 95
Potassium: 4.9

## 2011-01-15 LAB — HEMOGLOBIN A1C
Hgb A1c MFr Bld: 7.6 — ABNORMAL HIGH
Mean Plasma Glucose: 193

## 2011-01-15 LAB — URINALYSIS, ROUTINE W REFLEX MICROSCOPIC
Glucose, UA: 250 — AB
Hgb urine dipstick: NEGATIVE
Protein, ur: 30 — AB
Specific Gravity, Urine: 1.034 — ABNORMAL HIGH
Urobilinogen, UA: 0.2

## 2011-01-15 LAB — DIFFERENTIAL
Eosinophils Absolute: 0
Lymphs Abs: 1.3
Neutro Abs: 10.7 — ABNORMAL HIGH
Neutrophils Relative %: 78 — ABNORMAL HIGH

## 2011-01-15 LAB — URINE MICROSCOPIC-ADD ON

## 2011-01-15 LAB — PROTIME-INR: Prothrombin Time: 14.7

## 2011-01-21 NOTE — Procedures (Unsigned)
BYPASS GRAFT EVALUATION  INDICATION:  Follow up bypass graft.  HISTORY: Diabetes:  Yes. Cardiac: Hypertension:  Yes. Smoking:  No. Previous Surgery:  Left distal femoral vein to below-knee popliteal bypass graft, 06/14/2007.  SINGLE LEVEL ARTERIAL EXAM                              RIGHT              LEFT Brachial:                    204                202 Anterior tibial:                                166 Posterior tibial:                               168 Peroneal: Ankle/brachial index:        BKA                0.82  PREVIOUS ABI:  Date: 01/24/2010  RIGHT:  BKA  LEFT:  0.84  LOWER EXTREMITY BYPASS GRAFT DUPLEX EXAM:  DUPLEX:  Patent left distal femoral to below-knee popliteal bypass graft with velocity measurements noted on the following worksheet.  IMPRESSION: 1. Left ankle brachial index is suggestive of mild arterial disease     and remains stable in comparison to the previous examination. 2. Patent left bypass graft with an elevated velocity at the proximal     anastomosis.  Please see the following worksheet for all velocity     measurements.  ___________________________________________ [name], M.D.  EM/MEDQ  D:  01/13/2011  T:  01/13/2011  Job:  [id number]

## 2011-07-16 ENCOUNTER — Other Ambulatory Visit: Payer: Self-pay | Admitting: *Deleted

## 2011-07-16 DIAGNOSIS — Z48812 Encounter for surgical aftercare following surgery on the circulatory system: Secondary | ICD-10-CM

## 2011-07-16 DIAGNOSIS — I739 Peripheral vascular disease, unspecified: Secondary | ICD-10-CM

## 2011-07-17 ENCOUNTER — Encounter: Payer: Self-pay | Admitting: Vascular Surgery

## 2012-01-08 ENCOUNTER — Ambulatory Visit: Payer: Medicare Other | Admitting: Neurosurgery

## 2012-01-08 ENCOUNTER — Encounter: Payer: Self-pay | Admitting: Vascular Surgery

## 2012-01-14 ENCOUNTER — Encounter: Payer: Self-pay | Admitting: Neurosurgery

## 2012-01-15 ENCOUNTER — Ambulatory Visit (INDEPENDENT_AMBULATORY_CARE_PROVIDER_SITE_OTHER): Payer: Medicare Other | Admitting: Neurosurgery

## 2012-01-15 ENCOUNTER — Encounter (INDEPENDENT_AMBULATORY_CARE_PROVIDER_SITE_OTHER): Payer: Medicare Other | Admitting: *Deleted

## 2012-01-15 ENCOUNTER — Encounter: Payer: Self-pay | Admitting: Neurosurgery

## 2012-01-15 VITALS — BP 174/71 | HR 61 | Resp 22 | Ht 67.0 in | Wt 220.0 lb

## 2012-01-15 DIAGNOSIS — Z48812 Encounter for surgical aftercare following surgery on the circulatory system: Secondary | ICD-10-CM | POA: Insufficient documentation

## 2012-01-15 DIAGNOSIS — I70219 Atherosclerosis of native arteries of extremities with intermittent claudication, unspecified extremity: Secondary | ICD-10-CM | POA: Insufficient documentation

## 2012-01-15 DIAGNOSIS — I739 Peripheral vascular disease, unspecified: Secondary | ICD-10-CM

## 2012-01-15 NOTE — Progress Notes (Signed)
[facility] P[facility]/PVD Office Note  CC: PVD surveillance Referring Physician: Early  History of Present Illness: 71-year-old female patient of Dr. Early who status post a left distal SFA popliteal graft in 2009 with parietal BKA in 2009. The patient denies rest pain, claudication and has no open ulcerations on her left lower extremity. The patient uses a prosthesis on the right and is able to ambulate with a walker.  Past Medical History  Diagnosis Date  . Diabetes mellitus   . Arthritis   . Hypertension   . Peripheral vascular disease     ROS: [x] Positive   [ ] Denies    General: [ ] Weight loss, [ ] Fever, [ ] chills Neurologic: [ ] Dizziness, [ ] Blackouts, [ ] Seizure [ ] Stroke, [ ] "Mini stroke", [ ] Slurred speech, [ ] Temporary blindness; [ ] weakness in arms or legs, [ ] Hoarseness Cardiac: [ ] Chest pain/pressure, [ ] Shortness of breath at rest [ ] Shortness of breath with exertion, [ ] Atrial fibrillation or irregular heartbeat Vascular: [ ] Pain in legs with walking, [ ] Pain in legs at rest, [ ] Pain in legs at night,  [ ] Non-healing ulcer, [ ] Blood clot in vein/DVT,   Pulmonary: [ ] Home oxygen, [ ] Productive cough, [ ] Coughing up blood, [ ] Asthma,  [ ] Wheezing Musculoskeletal:  [ ] Arthritis, [ ] Low back pain, [ ] Joint pain Hematologic: [ ] Easy Bruising, [ ] Anemia; [ ] Hepatitis Gastrointestinal: [ ] Blood in stool, [ ] Gastroesophageal Reflux/heartburn, [ ] Trouble swallowing Urinary: [ ] chronic Kidney disease, [ ] on HD - [ ] MWF or [ ] TTHS, [ ] Burning with urination, [ ] Difficulty urinating Skin: [ ] Rashes, [ ] Wounds Psychological: [ ] Anxiety, [ ] Depression   Social History History  Substance Use Topics  . Smoking status: Current Some Day Smoker -- 0.1 packs/day for 40 years    Types: Cigarettes  . Smokeless tobacco: Not on file  . Alcohol Use: Not on file    Family History No family history on file.  No  Known Allergies  Current Outpatient Prescriptions  Medication Sig Dispense Refill  . amLODipine (NORVASC) 10 MG tablet Take 10 mg by mouth daily.      . atorvastatin (LIPITOR) 10 MG tablet Take 10 mg by mouth daily.      . calcium carbonate 200 MG capsule Take 250 mg by mouth daily.      . citalopram (CELEXA) 20 MG tablet Take 20 mg by mouth daily.      . ferrous sulfate 325 (65 FE) MG tablet Take 325 mg by mouth daily with breakfast.      . folic acid (FOLVITE) 1 MG tablet Take 1 mg by mouth daily.      . Furosemide (LASIX PO) Take by mouth.      . insulin glargine (LANTUS) 100 UNIT/ML injection Inject 30 Units into the skin at bedtime.      . insulin lispro (HUMALOG) 100 UNIT/ML injection Inject into the skin 3 (three) times daily before meals.      . KRILL OIL PO Take by mouth.      . lisinopril (PRINIVIL,ZESTRIL) 5 MG tablet Take 5 mg by mouth daily.      . metoprolol succinate (TOPROL-XL) 100 MG 24 hr tablet Take 100 mg by mouth daily. Take with or immediately following   a meal.      . Multiple Vitamin (MULTIVITAMIN) tablet Take 1 tablet by mouth daily.      . omeprazole (PRILOSEC) 20 MG capsule Take 20 mg by mouth daily.      . pyridOXINE (VITAMIN B-6) 100 MG tablet Take 100 mg by mouth daily.        Physical Examination  Filed Vitals:   01/15/12 1124  BP: 174/71  Pulse: 61  Resp: 22    Body mass index is 34.46 kg/(m^2).  General:  WDWN in NAD Gait: Normal HEENT: WNL Eyes: Pupils equal Pulmonary: normal non-labored breathing , without Rales, rhonchi,  wheezing Cardiac: RRR, without  Murmurs, rubs or gallops; No carotid bruits Abdomen: soft, NT, no masses Skin: no rashes, ulcers noted Vascular Exam/Pulses: Left lower extremity pulses are dampened but palpable  Extremities without ischemic changes, no Gangrene , no cellulitis; no open wounds;  Musculoskeletal: no muscle wasting or atrophy  Neurologic: A&O X 3; Appropriate Affect ; SENSATION: normal; MOTOR FUNCTION:   moving all extremities equally. Speech is fluent/normal  Non-Invasive Vascular Imaging: Left ABI today is 0.78 and biphasic however the duplex that showed elevations with an inflow elevation of 356 and a mid graft elevation of 303. I reviewed this with Dr. Chen who recommends the patient undergo arteriogram with Dr. Early with possible intervention.  ASSESSMENT/PLAN: Asymptomatic patient with elevated velocities in the left distal SFA popliteal graft. The patient will be scheduled for arteriogram with possible intervention with Dr. Early is since possible. The patient's in agreement with this, her questions were encouraged and answered, followup will be with Dr. Early after the procedure.  Maysa Lynn ANP  Clinic M.D.: Chen  

## 2012-01-22 ENCOUNTER — Encounter (HOSPITAL_COMMUNITY): Payer: Self-pay | Admitting: Pharmacy Technician

## 2012-01-22 ENCOUNTER — Other Ambulatory Visit: Payer: Self-pay | Admitting: *Deleted

## 2012-01-26 MED ORDER — SODIUM CHLORIDE 0.9 % IV SOLN
INTRAVENOUS | Status: DC
  Administered 2012-01-27: 12:00:00 via INTRAVENOUS

## 2012-01-27 ENCOUNTER — Ambulatory Visit (HOSPITAL_COMMUNITY)
Admission: RE | Admit: 2012-01-27 | Discharge: 2012-01-27 | Disposition: A | Discharge status: Home / Self Care | Payer: Medicare Other | Source: Ambulatory Visit | Attending: Vascular Surgery | Admitting: Vascular Surgery

## 2012-01-27 ENCOUNTER — Encounter (HOSPITAL_COMMUNITY)
Admission: RE | Disposition: A | Discharge status: Home / Self Care | Payer: Self-pay | Source: Ambulatory Visit | Attending: Vascular Surgery

## 2012-01-27 DIAGNOSIS — T82898A Other specified complication of vascular prosthetic devices, implants and grafts, initial encounter: Secondary | ICD-10-CM | POA: Insufficient documentation

## 2012-01-27 DIAGNOSIS — I739 Peripheral vascular disease, unspecified: Secondary | ICD-10-CM

## 2012-01-27 DIAGNOSIS — E119 Type 2 diabetes mellitus without complications: Secondary | ICD-10-CM | POA: Insufficient documentation

## 2012-01-27 DIAGNOSIS — I70209 Unspecified atherosclerosis of native arteries of extremities, unspecified extremity: Secondary | ICD-10-CM | POA: Insufficient documentation

## 2012-01-27 DIAGNOSIS — Y832 Surgical operation with anastomosis, bypass or graft as the cause of abnormal reaction of the patient, or of later complication, without mention of misadventure at the time of the procedure: Secondary | ICD-10-CM | POA: Insufficient documentation

## 2012-01-27 DIAGNOSIS — I10 Essential (primary) hypertension: Secondary | ICD-10-CM | POA: Insufficient documentation

## 2012-01-27 HISTORY — PX: INTRAOPERATIVE ARTERIOGRAM: SHX5157

## 2012-01-27 LAB — POCT I-STAT, CHEM 8
Calcium, Ion: 1.2 mmol/L (ref 1.13–1.30)
Creatinine, Ser: 1.8 mg/dL — ABNORMAL HIGH (ref 0.50–1.10)
Glucose, Bld: 183 mg/dL — ABNORMAL HIGH (ref 70–99)
HCT: 39 % (ref 36.0–46.0)
Hemoglobin: 13.3 g/dL (ref 12.0–15.0)

## 2012-01-27 SURGERY — ANGIOGRAM EXTREMITY LEFT

## 2012-01-27 MED ORDER — ONDANSETRON HCL 4 MG/2ML IJ SOLN: 4.0000 mg | Freq: Four times a day (QID) | INTRAMUSCULAR | Status: DC | PRN

## 2012-01-27 MED ORDER — MIDAZOLAM HCL 2 MG/2ML IJ SOLN
INTRAMUSCULAR | Status: AC
  Filled 2012-01-27: qty 2

## 2012-01-27 MED ORDER — LIDOCAINE HCL (PF) 1 % IJ SOLN
INTRAMUSCULAR | Status: AC
  Filled 2012-01-27: qty 30

## 2012-01-27 MED ORDER — SODIUM CHLORIDE 0.9 % IV SOLN: 1.0000 mL/kg/h | INTRAVENOUS | Status: DC

## 2012-01-27 MED ORDER — FENTANYL CITRATE 0.05 MG/ML IJ SOLN
INTRAMUSCULAR | Status: AC
  Filled 2012-01-27: qty 2

## 2012-01-27 MED ORDER — ACETAMINOPHEN 325 MG PO TABS: 650.0000 mg | ORAL_TABLET | ORAL | Status: DC | PRN

## 2012-01-27 NOTE — Interval H&P Note (Signed)
History and Physical Interval Note:  01/27/2012 11:46 AM  [name] [name]  has presented today for surgery, with the diagnosis of pvd  The various methods of treatment have been discussed with the patient and family. After consideration of risks, benefits and other options for treatment, the patient has consented to  Procedure(s) (LRB) with comments: ABDOMINAL AORTAGRAM (N/A) as a surgical intervention .  The patient's history has been reviewed, patient examined, no change in status, stable for surgery.  I have reviewed the patient's chart and labs.  Questions were answered to the patient's satisfaction.     [name]

## 2012-01-27 NOTE — Progress Notes (Signed)
DR Imogene Burn NOTIFIED OF O2 SAT 90 AND NO NEW ORDERS NOTED

## 2012-01-27 NOTE — Op Note (Signed)
OPERATIVE REPORT  DATE OF SURGERY: 01/27/2012  PATIENT: [name] [name], 71 y.o. female MRN: [id number]  DOB: Mar 03, 1941  PRE-OPERATIVE DIAGNOSIS: Left superficial femoral to below-knee popliteal artery with possible vein graft stenosis  POST-OPERATIVE DIAGNOSIS:  Same  PROCEDURE: Left leg arteriogram  SURGEON:  Gretta Began, M.D.   ANESTHESIA:  1% lidocaine local with IV sedation  EBL: Minimal ml     BLOOD ADMINISTERED: None  DRAINS: None  SPECIMEN: None  COUNTS CORRECT:  YES  PLAN OF CARE: Cath lab holding area   PATIENT DISPOSITION:  PACU - hemodynamically stable  PROCEDURE DETAILS: The patient was taken to progress catheter placed supine position where the area of both groins are prepped in the sterile fashion. Using SonoSite ultrasound the right common femoral artery was entered and a guidewire was passed the level of the suprarenal aorta. A 5 French sheath was passed over the guidewire. A crossover catheter was passed over the guidewire and the guidewire was withdrawn to allow the catheter to be positioned in the common iliac artery on the left. The guidewire was passed down the common iliac artery preferentially went into the internal iliac artery. This was quite use of a Glidewire as well. Decision was made to simply do injections through the common femoral artery for exposure. The pelvis injection revealed wide patency of the common external and internal iliac arteries with the common femoral artery also being widely patent  Long-leg runoff was obtained. This showed patency of the superficial artery proximally. There was anastomosed to the distal superficial femoral artery with a vein graft to the level down to the below knee popliteal artery. Runoff was mainly to the posterior tibial artery. There was occlusion and reconstitution of the proximal anterior tibial and peroneal arteries. There was some irregularity and narrowing in the mid vein graft and also has some moderate  stenosis of the vein graft near the arterial anastomosis proximally. There was a sharp angle was this arose from the suprasternal artery.  Discuss these findings with the patient and her family. I do not feel that this is warranted tube do a revision of her proximal and mid graft narrowing. These both appear to be moderate. This would essentially entail replacing her  . She does remain asymptomatic and we would watch this further in our nonevasive lab. The patient tolerated immediate complications and was transferred to the holding area where sheath was pulled   Gretta Began, M.D. 01/27/2012 4:38 PM

## 2012-01-27 NOTE — H&P (View-Only) (Signed)
[facility] Office Note  CC: PVD surveillance Referring Physician: Early  History of Present Illness: 71 year old female patient of Dr. Arbie Cookey who status post a left distal SFA popliteal graft in 2009 with parietal BKA in 2009. The patient denies rest pain, claudication and has no open ulcerations on her left lower extremity. The patient uses a prosthesis on the right and is able to ambulate with a walker.  Past Medical History  Diagnosis Date  . Diabetes mellitus   . Arthritis   . Hypertension   . Peripheral vascular disease     ROS: [x]  Positive   [ ]  Denies    General: [ ]  Weight loss, [ ]  Fever, [ ]  chills Neurologic: [ ]  Dizziness, [ ]  Blackouts, [ ]  Seizure [ ]  Stroke, [ ]  "Mini stroke", [ ]  Slurred speech, [ ]  Temporary blindness; [ ]  weakness in arms or legs, [ ]  Hoarseness Cardiac: [ ]  Chest pain/pressure, [ ]  Shortness of breath at rest [ ]  Shortness of breath with exertion, [ ]  Atrial fibrillation or irregular heartbeat Vascular: [ ]  Pain in legs with walking, [ ]  Pain in legs at rest, [ ]  Pain in legs at night,  [ ]  Non-healing ulcer, [ ]  Blood clot in vein/DVT,   Pulmonary: [ ]  Home oxygen, [ ]  Productive cough, [ ]  Coughing up blood, [ ]  Asthma,  [ ]  Wheezing Musculoskeletal:  [ ]  Arthritis, [ ]  Low back pain, [ ]  Joint pain Hematologic: [ ]  Easy Bruising, [ ]  Anemia; [ ]  Hepatitis Gastrointestinal: [ ]  Blood in stool, [ ]  Gastroesophageal Reflux/heartburn, [ ]  Trouble swallowing Urinary: [ ]  chronic Kidney disease, [ ]  on HD - [ ]  MWF or [ ]  TTHS, [ ]  Burning with urination, [ ]  Difficulty urinating Skin: [ ]  Rashes, [ ]  Wounds Psychological: [ ]  Anxiety, [ ]  Depression   Social History History  Substance Use Topics  . Smoking status: Current Some Day Smoker -- 0.1 packs/day for 40 years    Types: Cigarettes  . Smokeless tobacco: Not on file  . Alcohol Use: Not on file    Family History No family history on file.  No  Known Allergies  Current Outpatient Prescriptions  Medication Sig Dispense Refill  . amLODipine (NORVASC) 10 MG tablet Take 10 mg by mouth daily.      Marland Kitchen atorvastatin (LIPITOR) 10 MG tablet Take 10 mg by mouth daily.      . calcium carbonate 200 MG capsule Take 250 mg by mouth daily.      . citalopram (CELEXA) 20 MG tablet Take 20 mg by mouth daily.      . ferrous sulfate 325 (65 FE) MG tablet Take 325 mg by mouth daily with breakfast.      . folic acid (FOLVITE) 1 MG tablet Take 1 mg by mouth daily.      . Furosemide (LASIX PO) Take by mouth.      . insulin glargine (LANTUS) 100 UNIT/ML injection Inject 30 Units into the skin at bedtime.      . insulin lispro (HUMALOG) 100 UNIT/ML injection Inject into the skin 3 (three) times daily before meals.      Marland Kitchen KRILL OIL PO Take by mouth.      Marland Kitchen lisinopril (PRINIVIL,ZESTRIL) 5 MG tablet Take 5 mg by mouth daily.      . metoprolol succinate (TOPROL-XL) 100 MG 24 hr tablet Take 100 mg by mouth daily. Take with or immediately following  a meal.      . Multiple Vitamin (MULTIVITAMIN) tablet Take 1 tablet by mouth daily.      Marland Kitchen omeprazole (PRILOSEC) 20 MG capsule Take 20 mg by mouth daily.      Marland Kitchen pyridOXINE (VITAMIN B-6) 100 MG tablet Take 100 mg by mouth daily.        Physical Examination  Filed Vitals:   01/15/12 1124  BP: 174/71  Pulse: 61  Resp: 22    Body mass index is 34.46 kg/(m^2).  General:  WDWN in NAD Gait: Normal HEENT: WNL Eyes: Pupils equal Pulmonary: normal non-labored breathing , without Rales, rhonchi,  wheezing Cardiac: RRR, without  Murmurs, rubs or gallops; No carotid bruits Abdomen: soft, NT, no masses Skin: no rashes, ulcers noted Vascular Exam/Pulses: Left lower extremity pulses are dampened but palpable  Extremities without ischemic changes, no Gangrene , no cellulitis; no open wounds;  Musculoskeletal: no muscle wasting or atrophy  Neurologic: A&O X 3; Appropriate Affect ; SENSATION: normal; MOTOR FUNCTION:   moving all extremities equally. Speech is fluent/normal  Non-Invasive Vascular Imaging: Left ABI today is 0.78 and biphasic however the duplex that showed elevations with an inflow elevation of 356 and a mid graft elevation of 303. I reviewed this with Dr. Imogene Burn who recommends the patient undergo arteriogram with Dr. Arbie Cookey with possible intervention.  ASSESSMENT/PLAN: Asymptomatic patient with elevated velocities in the left distal SFA popliteal graft. The patient will be scheduled for arteriogram with possible intervention with Dr. Arbie Cookey is since possible. The patient's in agreement with this, her questions were encouraged and answered, followup will be with Dr. Arbie Cookey after the procedure.  Lauree Chandler ANP  Clinic M.D.: Imogene Burn

## 2012-01-28 ENCOUNTER — Encounter (HOSPITAL_COMMUNITY): Payer: Self-pay

## 2012-01-28 ENCOUNTER — Other Ambulatory Visit: Payer: Self-pay | Admitting: *Deleted

## 2012-01-28 ENCOUNTER — Telehealth: Payer: Self-pay | Admitting: Vascular Surgery

## 2012-01-28 DIAGNOSIS — Z48812 Encounter for surgical aftercare following surgery on the circulatory system: Secondary | ICD-10-CM

## 2012-01-28 DIAGNOSIS — I739 Peripheral vascular disease, unspecified: Secondary | ICD-10-CM

## 2012-01-28 NOTE — Telephone Encounter (Signed)
[name] [name] had a left leg arteriogram had a 2 catheterization of the left common iliac artery via the right femoral approach. She does not need surgery. These see me in the office with a ABI and graft scan in 3 months and office visit with me.  I scheduled an appt for the above patient for 05/03/12 at 1pm. I mailed a letter to the patient and also spoke with her regarding the upcoming appt/awt

## 2012-05-02 ENCOUNTER — Encounter: Payer: Self-pay | Admitting: Vascular Surgery

## 2012-05-03 ENCOUNTER — Ambulatory Visit: Payer: Medicare Other | Admitting: Vascular Surgery

## 2012-06-27 ENCOUNTER — Encounter: Payer: Self-pay | Admitting: Vascular Surgery

## 2012-06-28 ENCOUNTER — Ambulatory Visit (INDEPENDENT_AMBULATORY_CARE_PROVIDER_SITE_OTHER): Payer: Medicare Other | Admitting: Vascular Surgery

## 2012-06-28 ENCOUNTER — Encounter: Payer: Self-pay | Admitting: Vascular Surgery

## 2012-06-28 ENCOUNTER — Encounter (INDEPENDENT_AMBULATORY_CARE_PROVIDER_SITE_OTHER): Payer: Medicare Other | Admitting: *Deleted

## 2012-06-28 VITALS — BP 154/66 | HR 60 | Resp 16 | Ht 67.0 in | Wt 209.0 lb

## 2012-06-28 DIAGNOSIS — Z48812 Encounter for surgical aftercare following surgery on the circulatory system: Secondary | ICD-10-CM

## 2012-06-28 DIAGNOSIS — I739 Peripheral vascular disease, unspecified: Secondary | ICD-10-CM

## 2012-06-28 NOTE — Progress Notes (Signed)
Patient presents today for continued discussion of her left leg peripheral vascular occlusive disease. He is status post left superficial femoral to below-knee popliteal bypass in 2008. 3 months ago she had noninvasive studies suggesting a severe stenosis in her proximal anastomosis. She underwent arteriography and this appears to be more related to angulation of her graft origin and any significant stenosis. She continues to have some difficulty ambulating with a below-knee prosthesis on the right side. She is able to walk with a walker in public but is mostly in a wheelchair at home. She has no rest pain in her left foot.  Past Medical History  Diagnosis Date  . Diabetes mellitus   . Arthritis   . Hypertension   . Peripheral vascular disease     History  Substance Use Topics  . Smoking status: Current Some Day Smoker -- 0.10 packs/day for 40 years    Types: Cigarettes  . Smokeless tobacco: Never Used  . Alcohol Use: No    Family History  Problem Relation Age of Onset  . Hypertension Mother   . Cancer Mother     Skin - nose SCC  . Diabetes Father     No Known Allergies  Current outpatient prescriptions:amLODipine (NORVASC) 10 MG tablet, Take 10 mg by mouth daily., Disp: , Rfl: ;  aspirin 325 MG tablet, Take 325 mg by mouth daily., Disp: , Rfl: ;  atorvastatin (LIPITOR) 10 MG tablet, Take 10 mg by mouth at bedtime. , Disp: , Rfl: ;  Calcium Carbonate-Vitamin D (CALCIUM 600 + D PO), Take 1 tablet by mouth daily., Disp: , Rfl: ;  Cinnamon 500 MG capsule, Take 500 mg by mouth daily., Disp: , Rfl:  citalopram (CELEXA) 20 MG tablet, Take 20 mg by mouth daily., Disp: , Rfl: ;  Cyanocobalamin (B-12) 2500 MCG TABS, Take 2,500 mcg by mouth daily., Disp: , Rfl: ;  FeFum-FePo-FA-B Cmp-C-Zn-Mn-Cu (SE-TAN PLUS PO), Take 1 tablet by mouth daily., Disp: , Rfl: ;  folic acid (FOLVITE) 400 MCG tablet, Take 400 mcg by mouth daily., Disp: , Rfl: ;  furosemide (LASIX) 40 MG tablet, Take 40 mg by mouth  daily., Disp: , Rfl:  insulin glargine (LANTUS) 100 UNIT/ML injection, Inject 36 Units into the skin at bedtime. , Disp: , Rfl: ;  Insulin Lispro, Human, (HUMALOG PEN [place]), Inject 2-4 Units into the skin 2 (two) times daily., Disp: , Rfl: ;  metoprolol (LOPRESSOR) 100 MG tablet, Take 100 mg by mouth 2 (two) times daily., Disp: , Rfl: ;  Multiple Vitamin (MULTIVITAMIN) tablet, Take 1 tablet by mouth daily., Disp: , Rfl:  omeprazole (PRILOSEC) 20 MG capsule, Take 20 mg by mouth daily., Disp: , Rfl: ;  OVER THE COUNTER MEDICATION, Take 1 tablet by mouth daily. Mega Red 500mg ., Disp: , Rfl: ;  pyridOXINE (VITAMIN B-6) 100 MG tablet, Take 100 mg by mouth daily., Disp: , Rfl: ;  insulin aspart (NOVOLOG) 100 UNIT/ML injection, Inject 0-12 Units into the skin 2 (two) times daily after a meal. Per sliding scale., Disp: , Rfl:   BP 154/66  Pulse 60  Resp 16  Ht 5\' 7"  (1.702 m)  Wt 209 lb (94.802 kg)  BMI 32.73 kg/m2  SpO2 95%  Body mass index is 32.73 kg/(m^2).       Physical exam palpable popliteal graft pulse and a well-perfused left foot with no tissue loss. Neurologically she is grossly intact Skin without ulcers or rashes.  Noninvasive vascular laboratory studies today reveal patent left SFA  to popliteal bypass with stable ankle arm index at 0.74  Impression and plan a stable followup following left SFA topical bypass. We'll be seen at 6 month intervals for continued graft surveillance

## 2012-06-29 NOTE — Addendum Note (Signed)
Addended by: Adria Dill L on: 06/29/2012 09:17 AM   Modules accepted: Orders

## 2012-07-05 ENCOUNTER — Other Ambulatory Visit: Payer: Self-pay | Admitting: *Deleted

## 2012-07-12 ENCOUNTER — Other Ambulatory Visit: Payer: Self-pay | Admitting: *Deleted

## 2013-01-03 ENCOUNTER — Ambulatory Visit: Payer: Medicare Other | Admitting: Vascular Surgery

## 2013-01-05 ENCOUNTER — Other Ambulatory Visit: Payer: Self-pay | Admitting: *Deleted

## 2013-01-05 DIAGNOSIS — I739 Peripheral vascular disease, unspecified: Secondary | ICD-10-CM

## 2013-01-05 DIAGNOSIS — Z48812 Encounter for surgical aftercare following surgery on the circulatory system: Secondary | ICD-10-CM

## 2013-01-16 ENCOUNTER — Encounter: Payer: Self-pay | Admitting: Vascular Surgery

## 2013-01-17 ENCOUNTER — Ambulatory Visit (HOSPITAL_COMMUNITY)
Admission: RE | Admit: 2013-01-17 | Discharge: 2013-01-17 | Disposition: A | Discharge status: Home / Self Care | Payer: Medicare Other | Source: Ambulatory Visit | Attending: Vascular Surgery | Admitting: Vascular Surgery

## 2013-01-17 ENCOUNTER — Ambulatory Visit (INDEPENDENT_AMBULATORY_CARE_PROVIDER_SITE_OTHER)
Admission: RE | Admit: 2013-01-17 | Discharge: 2013-01-17 | Disposition: A | Discharge status: Home / Self Care | Payer: Medicare Other | Source: Ambulatory Visit | Attending: Vascular Surgery | Admitting: Vascular Surgery

## 2013-01-17 ENCOUNTER — Ambulatory Visit (INDEPENDENT_AMBULATORY_CARE_PROVIDER_SITE_OTHER): Payer: Medicare Other | Admitting: Vascular Surgery

## 2013-01-17 ENCOUNTER — Encounter: Payer: Self-pay | Admitting: Vascular Surgery

## 2013-01-17 VITALS — BP 179/61 | HR 60 | Resp 18 | Ht 67.0 in | Wt 220.5 lb

## 2013-01-17 DIAGNOSIS — Z48812 Encounter for surgical aftercare following surgery on the circulatory system: Secondary | ICD-10-CM

## 2013-01-17 DIAGNOSIS — I739 Peripheral vascular disease, unspecified: Secondary | ICD-10-CM

## 2013-01-17 NOTE — Progress Notes (Signed)
Today for continued followup of her left superficial femoral to popliteal bypass. She does walk independently with her wall the rolling walker. She reports that she has helped with housecleaning but otherwise is independent. She lives alone. He denies any claudication symptoms and no tissue loss in her left lower surety. 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  Past Medical History  Diagnosis Date  . Diabetes mellitus   . Arthritis   . Hypertension   . Peripheral vascular disease     History  Substance Use Topics  . Smoking status: Current Some Day Smoker -- 0.10 packs/day for 40 years    Types: Cigarettes  . Smokeless tobacco: Never Used  . Alcohol Use: No    Family History  Problem Relation Age of Onset  . Hypertension Mother   . Cancer Mother     Skin - nose SCC  . Diabetes Father     No Known Allergies  Current outpatient prescriptions:amLODipine (NORVASC) 10 MG tablet, Take 10 mg by mouth daily., Disp: , Rfl: ;  aspirin 325 MG tablet, Take 325 mg by mouth daily., Disp: , Rfl: ;  atorvastatin (LIPITOR) 10 MG tablet, Take 10 mg by mouth at bedtime. , Disp: , Rfl: ;  Calcium Carbonate-Vitamin D (CALCIUM 600 + D PO), Take 1 tablet by mouth daily., Disp: , Rfl: ;  Cinnamon 500 MG capsule, Take 500 mg by mouth daily., Disp: , Rfl:  citalopram (CELEXA) 20 MG tablet, Take 20 mg by mouth daily., Disp: , Rfl: ;  Cyanocobalamin (B-12) 2500 MCG TABS, Take 2,500 mcg by mouth daily., Disp: , Rfl: ;  FeFum-FePo-FA-B Cmp-C-Zn-Mn-Cu (SE-TAN PLUS PO), Take 1 tablet by mouth daily., Disp: , Rfl: ;  folic acid (FOLVITE) 400 MCG tablet, Take 400 mcg by mouth daily., Disp: , Rfl: ;  furosemide (LASIX) 40 MG tablet, Take 40 mg by mouth daily., Disp: , Rfl:  insulin aspart (NOVOLOG) 100 UNIT/ML injection, Inject 0-12 Units into the skin 2 (two) times daily after a meal. Per sliding scale., Disp: , Rfl: ;  insulin glargine (LANTUS) 100  UNIT/ML injection, Inject 36 Units into the skin at bedtime. , Disp: , Rfl: ;  Insulin Lispro, Human, (HUMALOG PEN [place]), Inject 2-4 Units into the skin 2 (two) times daily., Disp: , Rfl:  metoprolol (LOPRESSOR) 100 MG tablet, Take 100 mg by mouth 2 (two) times daily., Disp: , Rfl: ;  Multiple Vitamin (MULTIVITAMIN) tablet, Take 1 tablet by mouth daily., Disp: , Rfl: ;  omeprazole (PRILOSEC) 20 MG capsule, Take 20 mg by mouth daily., Disp: , Rfl: ;  OVER THE COUNTER MEDICATION, Take 1 tablet by mouth daily. Mega Red 500mg ., Disp: , Rfl: ;  pyridOXINE (VITAMIN B-6) 100 MG tablet, Take 100 mg by mouth daily., Disp: , Rfl:   BP 179/61  Pulse 60  Resp 18  Ht 5\' 7"  (1.702 m)  Wt 220 lb 8 oz (100.018 kg)  BMI 34.53 kg/m2  Body mass index is 34.53 kg/(m^2).       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 Her foot is warm and well perfused. She does have palpable popliteal graft pulse. Neurologically she is grossly intact Respirations nonlabored.  Noninvasive studies are office today reveal ankle arm index stable at 0.54. She does have biphasic posterior tibial signal. Duplex imaging reveals patency of her graft but difficult to see with some elevated velocities throughout  its course.  She had undergone an outpatient arteriogram by myself in October 2013 for evaluation of her graft. This did show a diffuse narrowing proximally and distally. It was felt she would require complete graft replacement for any treatment and certainly would not do this with a symptomatic exam. I did discuss symptoms of occlusive disease with patient to notify us should this occur. Otherwise we will see her on an as-needed basis. We will discontinue the followup protocol since I would not make any interaction and less she had symptoms

## 2013-06-22 ENCOUNTER — Inpatient Hospital Stay
Admission: AD | Admit: 2013-06-22 | Discharge: 2013-07-05 | Disposition: E | Payer: Self-pay | Source: Ambulatory Visit | Attending: Internal Medicine | Admitting: Internal Medicine

## 2013-06-22 LAB — BLOOD GAS, ARTERIAL
Acid-base deficit: 6.5 mmol/L — ABNORMAL HIGH (ref 0.0–2.0)
BICARBONATE: 18 meq/L — AB (ref 20.0–24.0)
Delivery systems: POSITIVE
EXPIRATORY PAP: 6
FIO2: 0.6 %
INSPIRATORY PAP: 12
O2 SAT: 89.9 %
PATIENT TEMPERATURE: 98.6
PH ART: 7.352 (ref 7.350–7.450)
TCO2: 19.1 mmol/L (ref 0–100)
pCO2 arterial: 33.4 mmHg — ABNORMAL LOW (ref 35.0–45.0)
pO2, Arterial: 61.9 mmHg — ABNORMAL LOW (ref 80.0–100.0)

## 2013-06-23 ENCOUNTER — Other Ambulatory Visit (HOSPITAL_COMMUNITY): Payer: Self-pay

## 2013-06-23 LAB — CBC WITH DIFFERENTIAL/PLATELET
BASOS ABS: 0 10*3/uL (ref 0.0–0.1)
BASOS PCT: 0 % (ref 0–1)
EOS ABS: 0 10*3/uL (ref 0.0–0.7)
EOS PCT: 0 % (ref 0–5)
HCT: 28.7 % — ABNORMAL LOW (ref 36.0–46.0)
Hemoglobin: 9.9 g/dL — ABNORMAL LOW (ref 12.0–15.0)
Lymphocytes Relative: 6 % — ABNORMAL LOW (ref 12–46)
Lymphs Abs: 1.3 10*3/uL (ref 0.7–4.0)
MCH: 29.2 pg (ref 26.0–34.0)
MCHC: 34.5 g/dL (ref 30.0–36.0)
MCV: 84.7 fL (ref 78.0–100.0)
Monocytes Absolute: 2.1 10*3/uL — ABNORMAL HIGH (ref 0.1–1.0)
Monocytes Relative: 9 % (ref 3–12)
Neutro Abs: 19 10*3/uL — ABNORMAL HIGH (ref 1.7–7.7)
Neutrophils Relative %: 85 % — ABNORMAL HIGH (ref 43–77)
PLATELETS: 137 10*3/uL — AB (ref 150–400)
RBC: 3.39 MIL/uL — ABNORMAL LOW (ref 3.87–5.11)
RDW: 15.5 % (ref 11.5–15.5)
WBC: 22.4 10*3/uL — AB (ref 4.0–10.5)

## 2013-06-23 LAB — URINE MICROSCOPIC-ADD ON

## 2013-06-23 LAB — URINALYSIS, ROUTINE W REFLEX MICROSCOPIC
Bilirubin Urine: NEGATIVE
Glucose, UA: 100 mg/dL — AB
Ketones, ur: NEGATIVE mg/dL
Nitrite: NEGATIVE
PROTEIN: 100 mg/dL — AB
Specific Gravity, Urine: 1.019 (ref 1.005–1.030)
Urobilinogen, UA: 0.2 mg/dL (ref 0.0–1.0)
pH: 5 (ref 5.0–8.0)

## 2013-06-23 LAB — FOLATE: Folate: >20 ng/mL

## 2013-06-23 LAB — COMPREHENSIVE METABOLIC PANEL
ALK PHOS: 58 U/L (ref 39–117)
ALT: 30 U/L (ref 0–35)
AST: 50 U/L — ABNORMAL HIGH (ref 0–37)
Albumin: 2.2 g/dL — ABNORMAL LOW (ref 3.5–5.2)
BUN: 122 mg/dL — AB (ref 6–23)
CO2: 16 mEq/L — ABNORMAL LOW (ref 19–32)
Calcium: 9 mg/dL (ref 8.4–10.5)
Chloride: 101 mEq/L (ref 96–112)
Creatinine, Ser: 2.58 mg/dL — ABNORMAL HIGH (ref 0.50–1.10)
GFR calc Af Amer: 20 mL/min — ABNORMAL LOW (ref >90)
GFR calc non Af Amer: 17 mL/min — ABNORMAL LOW (ref >90)
Glucose, Bld: 265 mg/dL — ABNORMAL HIGH (ref 70–99)
Potassium: 4.5 mEq/L (ref 3.7–5.3)
Sodium: 138 mEq/L (ref 137–147)
Total Bilirubin: 0.8 mg/dL (ref 0.3–1.2)
Total Protein: 5.6 g/dL — ABNORMAL LOW (ref 6.0–8.3)

## 2013-06-23 LAB — PHOSPHORUS: PHOSPHORUS: 4.8 mg/dL — AB (ref 2.3–4.6)

## 2013-06-23 LAB — TSH: TSH: 0.87 u[IU]/mL (ref 0.350–4.500)

## 2013-06-23 LAB — MAGNESIUM: MAGNESIUM: 2.1 mg/dL (ref 1.5–2.5)

## 2013-06-23 LAB — VITAMIN B12: VITAMIN B 12: 1853 pg/mL — AB (ref 211–911)

## 2013-06-23 LAB — PREALBUMIN: Prealbumin: 12.9 mg/dL — ABNORMAL LOW (ref 17.0–34.0)

## 2013-06-23 LAB — T4, FREE: Free T4: 1.31 ng/dL (ref 0.80–1.80)

## 2013-06-24 ENCOUNTER — Other Ambulatory Visit (HOSPITAL_COMMUNITY): Payer: Self-pay

## 2013-06-24 LAB — CBC WITH DIFFERENTIAL/PLATELET
BASOS ABS: 0 10*3/uL (ref 0.0–0.1)
Basophils Relative: 0 % (ref 0–1)
EOS ABS: 0 10*3/uL (ref 0.0–0.7)
EOS PCT: 0 % (ref 0–5)
HCT: 29.3 % — ABNORMAL LOW (ref 36.0–46.0)
Hemoglobin: 10 g/dL — ABNORMAL LOW (ref 12.0–15.0)
Lymphocytes Relative: 5 % — ABNORMAL LOW (ref 12–46)
Lymphs Abs: 1.4 10*3/uL (ref 0.7–4.0)
MCH: 29.3 pg (ref 26.0–34.0)
MCHC: 34.1 g/dL (ref 30.0–36.0)
MCV: 85.9 fL (ref 78.0–100.0)
MONO ABS: 1.4 10*3/uL — AB (ref 0.1–1.0)
Monocytes Relative: 5 % (ref 3–12)
NEUTROS PCT: 90 % — AB (ref 43–77)
Neutro Abs: 25.8 10*3/uL — ABNORMAL HIGH (ref 1.7–7.7)
Platelets: 147 10*3/uL — ABNORMAL LOW (ref 150–400)
RBC: 3.41 MIL/uL — AB (ref 3.87–5.11)
RDW: 15.6 % — AB (ref 11.5–15.5)
WBC: 28.6 10*3/uL — ABNORMAL HIGH (ref 4.0–10.5)

## 2013-06-24 LAB — URINE CULTURE
Colony Count: NO GROWTH
Culture: NO GROWTH

## 2013-06-24 LAB — BASIC METABOLIC PANEL
BUN: 135 mg/dL — ABNORMAL HIGH (ref 6–23)
CALCIUM: 9.2 mg/dL (ref 8.4–10.5)
CO2: 12 meq/L — AB (ref 19–32)
CREATININE: 3.01 mg/dL — AB (ref 0.50–1.10)
Chloride: 98 mEq/L (ref 96–112)
GFR calc Af Amer: 17 mL/min — ABNORMAL LOW (ref >90)
GFR, EST NON AFRICAN AMERICAN: 14 mL/min — AB (ref >90)
Glucose, Bld: 449 mg/dL — ABNORMAL HIGH (ref 70–99)
Potassium: 5 mEq/L (ref 3.7–5.3)
SODIUM: 139 meq/L (ref 137–147)

## 2013-06-24 LAB — PROCALCITONIN: PROCALCITONIN: 0.4 ng/mL

## 2013-06-24 MED ORDER — HEPARIN SODIUM (PORCINE) 1000 UNIT/ML IJ SOLN
INTRAMUSCULAR | Status: AC
  Filled 2013-06-24: qty 1

## 2013-06-24 NOTE — Procedures (Signed)
RIJV Temp HD catheter Tip SVC RA No comp

## 2013-06-25 ENCOUNTER — Other Ambulatory Visit (HOSPITAL_COMMUNITY): Payer: Self-pay

## 2013-06-25 LAB — CBC WITH DIFFERENTIAL/PLATELET
BASOS PCT: 0 % (ref 0–1)
Basophils Absolute: 0 10*3/uL (ref 0.0–0.1)
EOS ABS: 0 10*3/uL (ref 0.0–0.7)
EOS PCT: 0 % (ref 0–5)
HCT: 26.6 % — ABNORMAL LOW (ref 36.0–46.0)
Hemoglobin: 9.1 g/dL — ABNORMAL LOW (ref 12.0–15.0)
Lymphocytes Relative: 6 % — ABNORMAL LOW (ref 12–46)
Lymphs Abs: 1.5 10*3/uL (ref 0.7–4.0)
MCH: 29.4 pg (ref 26.0–34.0)
MCHC: 34.2 g/dL (ref 30.0–36.0)
MCV: 86.1 fL (ref 78.0–100.0)
MONOS PCT: 5 % (ref 3–12)
Monocytes Absolute: 1.2 10*3/uL — ABNORMAL HIGH (ref 0.1–1.0)
NEUTROS PCT: 89 % — AB (ref 43–77)
Neutro Abs: 22.1 10*3/uL — ABNORMAL HIGH (ref 1.7–7.7)
Platelets: 190 10*3/uL (ref 150–400)
RBC: 3.09 MIL/uL — ABNORMAL LOW (ref 3.87–5.11)
RDW: 15.9 % — ABNORMAL HIGH (ref 11.5–15.5)
WBC: 24.9 10*3/uL — ABNORMAL HIGH (ref 4.0–10.5)

## 2013-06-25 LAB — BASIC METABOLIC PANEL
BUN: 109 mg/dL — ABNORMAL HIGH (ref 6–23)
CALCIUM: 9.3 mg/dL (ref 8.4–10.5)
CO2: 19 mEq/L (ref 19–32)
Chloride: 102 mEq/L (ref 96–112)
Creatinine, Ser: 3.14 mg/dL — ABNORMAL HIGH (ref 0.50–1.10)
GFR calc non Af Amer: 14 mL/min — ABNORMAL LOW (ref >90)
GFR, EST AFRICAN AMERICAN: 16 mL/min — AB (ref >90)
Glucose, Bld: 275 mg/dL — ABNORMAL HIGH (ref 70–99)
POTASSIUM: 3.9 meq/L (ref 3.7–5.3)
Sodium: 143 mEq/L (ref 137–147)

## 2013-06-25 LAB — PRO B NATRIURETIC PEPTIDE: Pro B Natriuretic peptide (BNP): 67524 pg/mL — ABNORMAL HIGH (ref 0–125)

## 2013-06-25 LAB — HEPATITIS B CORE ANTIBODY, TOTAL: Hep B Core Total Ab: NONREACTIVE

## 2013-06-25 LAB — HEPATITIS B SURFACE ANTIGEN: Hepatitis B Surface Ag: NEGATIVE

## 2013-06-25 LAB — VANCOMYCIN, TROUGH: Vancomycin Tr: 10.3 ug/mL (ref 10.0–20.0)

## 2013-06-25 LAB — HEPATITIS B SURFACE ANTIBODY,QUALITATIVE: Hep B S Ab: NEGATIVE

## 2013-06-26 ENCOUNTER — Other Ambulatory Visit (HOSPITAL_COMMUNITY): Payer: Medicare Other

## 2013-06-26 LAB — CBC WITH DIFFERENTIAL/PLATELET
BASOS ABS: 0 10*3/uL (ref 0.0–0.1)
Basophils Relative: 0 % (ref 0–1)
EOS ABS: 0 10*3/uL (ref 0.0–0.7)
EOS PCT: 0 % (ref 0–5)
HCT: 25.1 % — ABNORMAL LOW (ref 36.0–46.0)
Hemoglobin: 8.7 g/dL — ABNORMAL LOW (ref 12.0–15.0)
Lymphocytes Relative: 5 % — ABNORMAL LOW (ref 12–46)
Lymphs Abs: 1 10*3/uL (ref 0.7–4.0)
MCH: 30.4 pg (ref 26.0–34.0)
MCHC: 34.7 g/dL (ref 30.0–36.0)
MCV: 87.8 fL (ref 78.0–100.0)
Monocytes Absolute: 0.9 10*3/uL (ref 0.1–1.0)
Monocytes Relative: 5 % (ref 3–12)
Neutro Abs: 18.4 10*3/uL — ABNORMAL HIGH (ref 1.7–7.7)
Neutrophils Relative %: 91 % — ABNORMAL HIGH (ref 43–77)
PLATELETS: 153 10*3/uL (ref 150–400)
RBC: 2.86 MIL/uL — ABNORMAL LOW (ref 3.87–5.11)
RDW: 16.4 % — ABNORMAL HIGH (ref 11.5–15.5)
WBC: 20.3 10*3/uL — ABNORMAL HIGH (ref 4.0–10.5)

## 2013-06-26 LAB — COMPREHENSIVE METABOLIC PANEL
ALT: 256 U/L — ABNORMAL HIGH (ref 0–35)
AST: 334 U/L — AB (ref 0–37)
Albumin: 2.2 g/dL — ABNORMAL LOW (ref 3.5–5.2)
Alkaline Phosphatase: 123 U/L — ABNORMAL HIGH (ref 39–117)
BUN: 133 mg/dL — ABNORMAL HIGH (ref 6–23)
CO2: 18 mEq/L — ABNORMAL LOW (ref 19–32)
Calcium: 9.2 mg/dL (ref 8.4–10.5)
Chloride: 106 mEq/L (ref 96–112)
Creatinine, Ser: 4.28 mg/dL — ABNORMAL HIGH (ref 0.50–1.10)
GFR calc non Af Amer: 9 mL/min — ABNORMAL LOW (ref >90)
GFR, EST AFRICAN AMERICAN: 11 mL/min — AB (ref >90)
GLUCOSE: 223 mg/dL — AB (ref 70–99)
Potassium: 4.3 mEq/L (ref 3.7–5.3)
SODIUM: 149 meq/L — AB (ref 137–147)
TOTAL PROTEIN: 5.4 g/dL — AB (ref 6.0–8.3)
Total Bilirubin: 1.7 mg/dL — ABNORMAL HIGH (ref 0.3–1.2)

## 2013-06-26 LAB — PROCALCITONIN: Procalcitonin: 0.9 ng/mL

## 2013-06-26 LAB — PRO B NATRIURETIC PEPTIDE: Pro B Natriuretic peptide (BNP): >70000 pg/mL — ABNORMAL HIGH (ref 0–125)

## 2013-06-29 LAB — CULTURE, BLOOD (ROUTINE X 2)
Culture: NO GROWTH
Culture: NO GROWTH

## 2013-07-05 DEATH — deceased

## 2014-10-31 IMAGING — CR DG CHEST 1V PORT
1 series · 1 of 1 positions shown · non-contrast
Comparison: DG CHEST 1V PORT dated 06/24/2013

CLINICAL DATA: Line placement

EXAM:
PORTABLE CHEST - 1 VIEW

[AP]
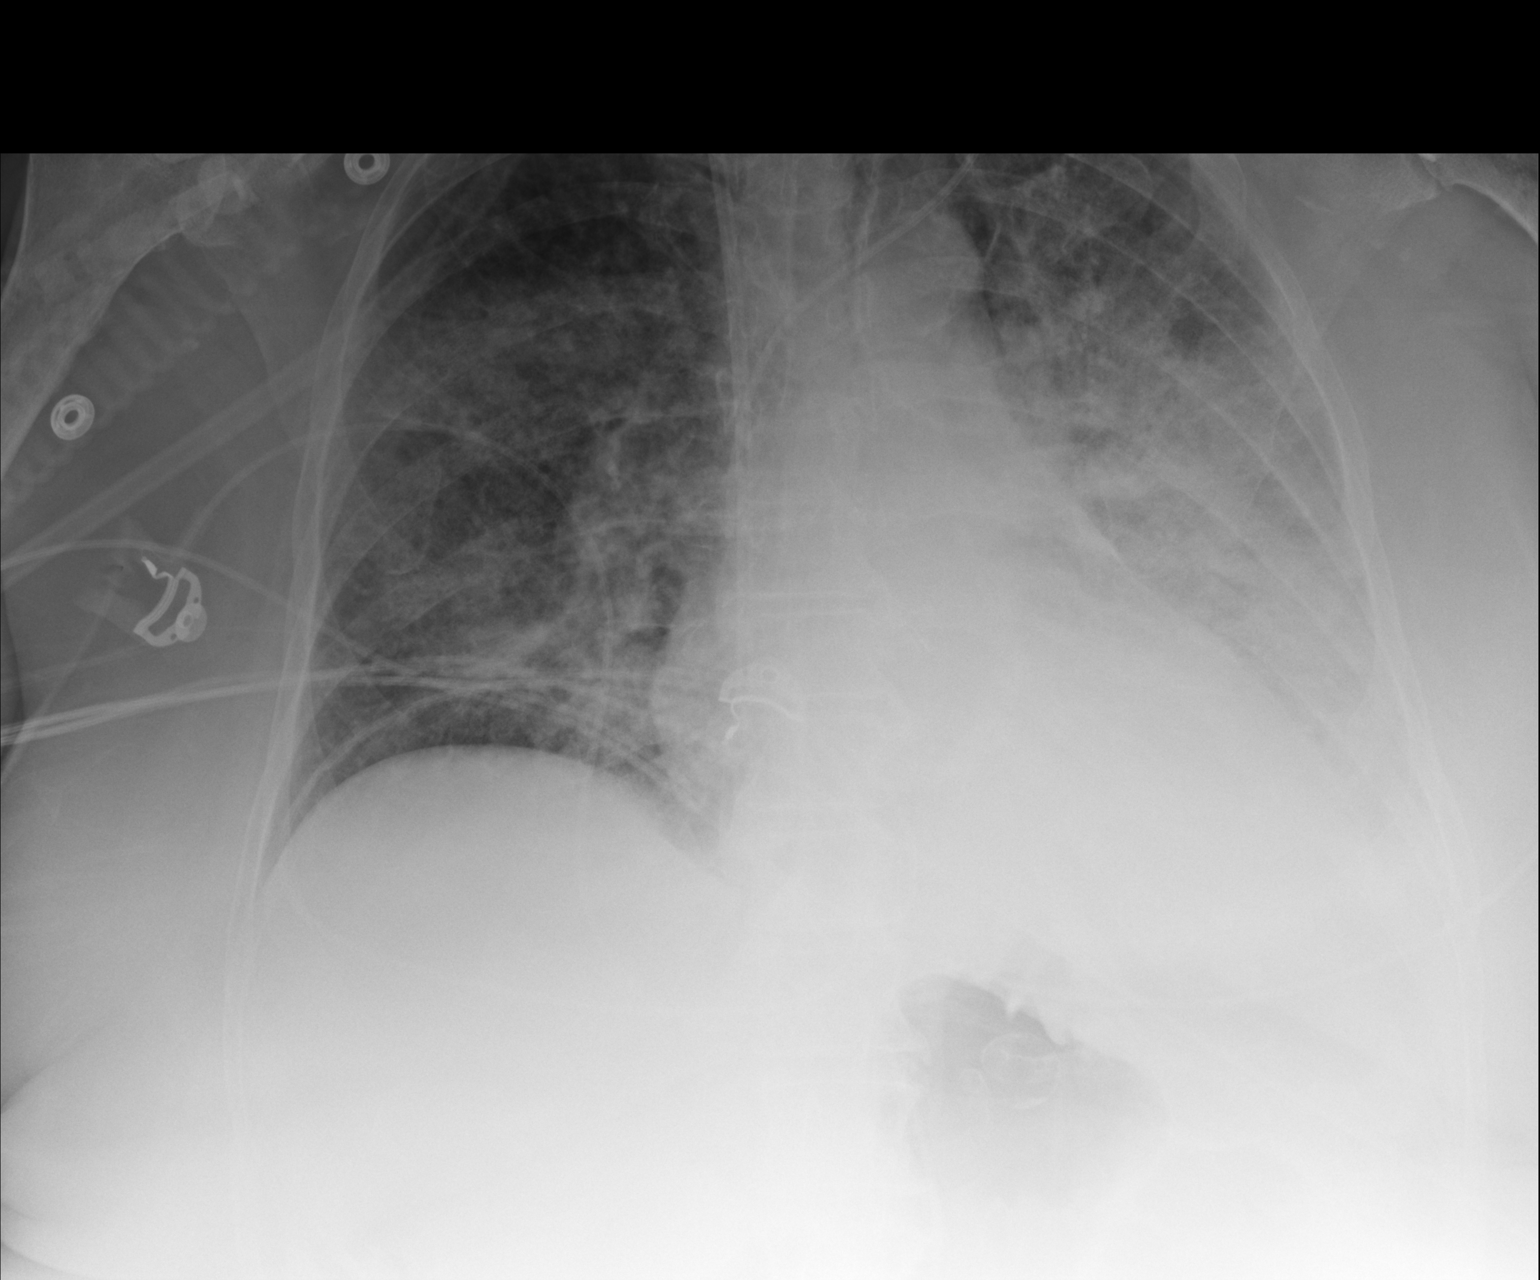

[1 of 1 positions shown; findings below may reference images not displayed]

FINDINGS: New right internal jugular dialysis catheter. Tip is at the
cavoatrial junction. New left internal jugular central venous
catheter. Tip is in the lower SVC. No pneumothorax. Diffuse
pulmonary edema has improved. Normal heart size. Low volumes.
IMPRESSION: New right and left internal jugular central venous catheters as
described. The tip of the left internal jugular catheter is in the
lower SVC. No pneumothorax.

Improved edema.

## 2014-10-31 IMAGING — US IR US GUIDE VASC ACCESS RIGHT
1 series · 2 of 2 positions shown · non-contrast
Comparison: none

CLINICAL DATA: Renal failure

[Series 1: ir us guide vasc access right · 2 of 2 slices shown]
[im 1/2]
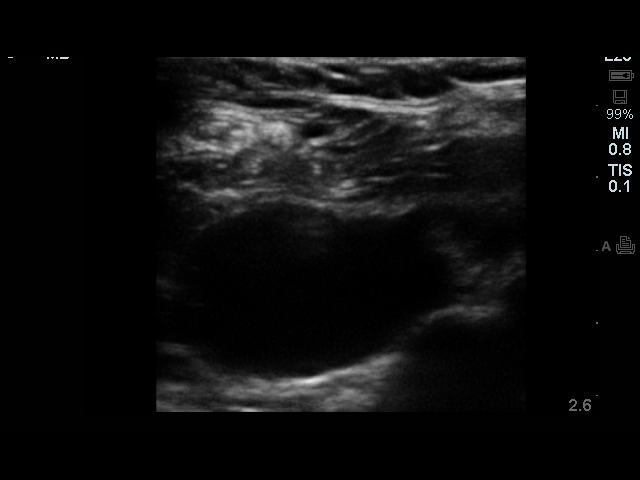
[im 2/2]
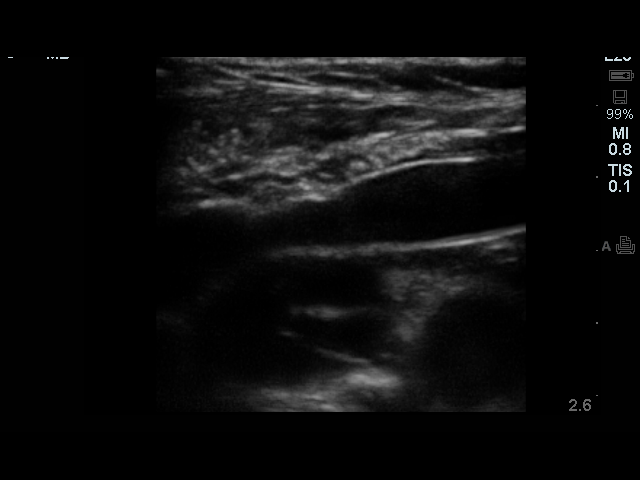

[2 of 2 positions shown; findings below may reference images not displayed]

EXAM:
IR RIGHT FLOURO GUIDE CV LINE; IR ULTRASOUND GUIDANCE VASC ACCESS
RIGHT

FLUOROSCOPY TIME:  24 seconds.

MEDICATIONS AND MEDICAL HISTORY:
None

ANESTHESIA/SEDATION:
None.

CONTRAST:  None.

PROCEDURE:
The procedure, risks, benefits, and alternatives were explained to
the patient. Questions regarding the procedure were encouraged and
answered. The patient understands and consents to the procedure.

The right neck was prepped with Betadine in a sterile fashion, and a
sterile drape was applied covering the operative field. A sterile
gown and sterile gloves were used for the procedure.

Under sonographic guidance, a micropuncture needle was inserted into
the right internal jugular vein and removed over a 018 wire which
was up sized to a 3 J. The dilator and temporary dialysis catheter
was then advanced over the wire. It was flushed and some in place.
FINDINGS: Tip of the temporary dialysis catheter is at the cavoatrial
junction. Sonographic documentation for vascular access was
obtained.

COMPLICATIONS:
None
IMPRESSION: Successful right internal jugular temporary dialysis catheter
placement.

## 2014-10-31 IMAGING — CR DG CHEST 1V PORT
1 series · 1 of 1 positions shown · non-contrast
Comparison: Portable exam 2659 hr compared to 06/23/2013

CLINICAL DATA: Respiratory failure, followup pneumonia, CHF,
history hypertension, diabetes

EXAM:
PORTABLE CHEST - 1 VIEW

[AP]
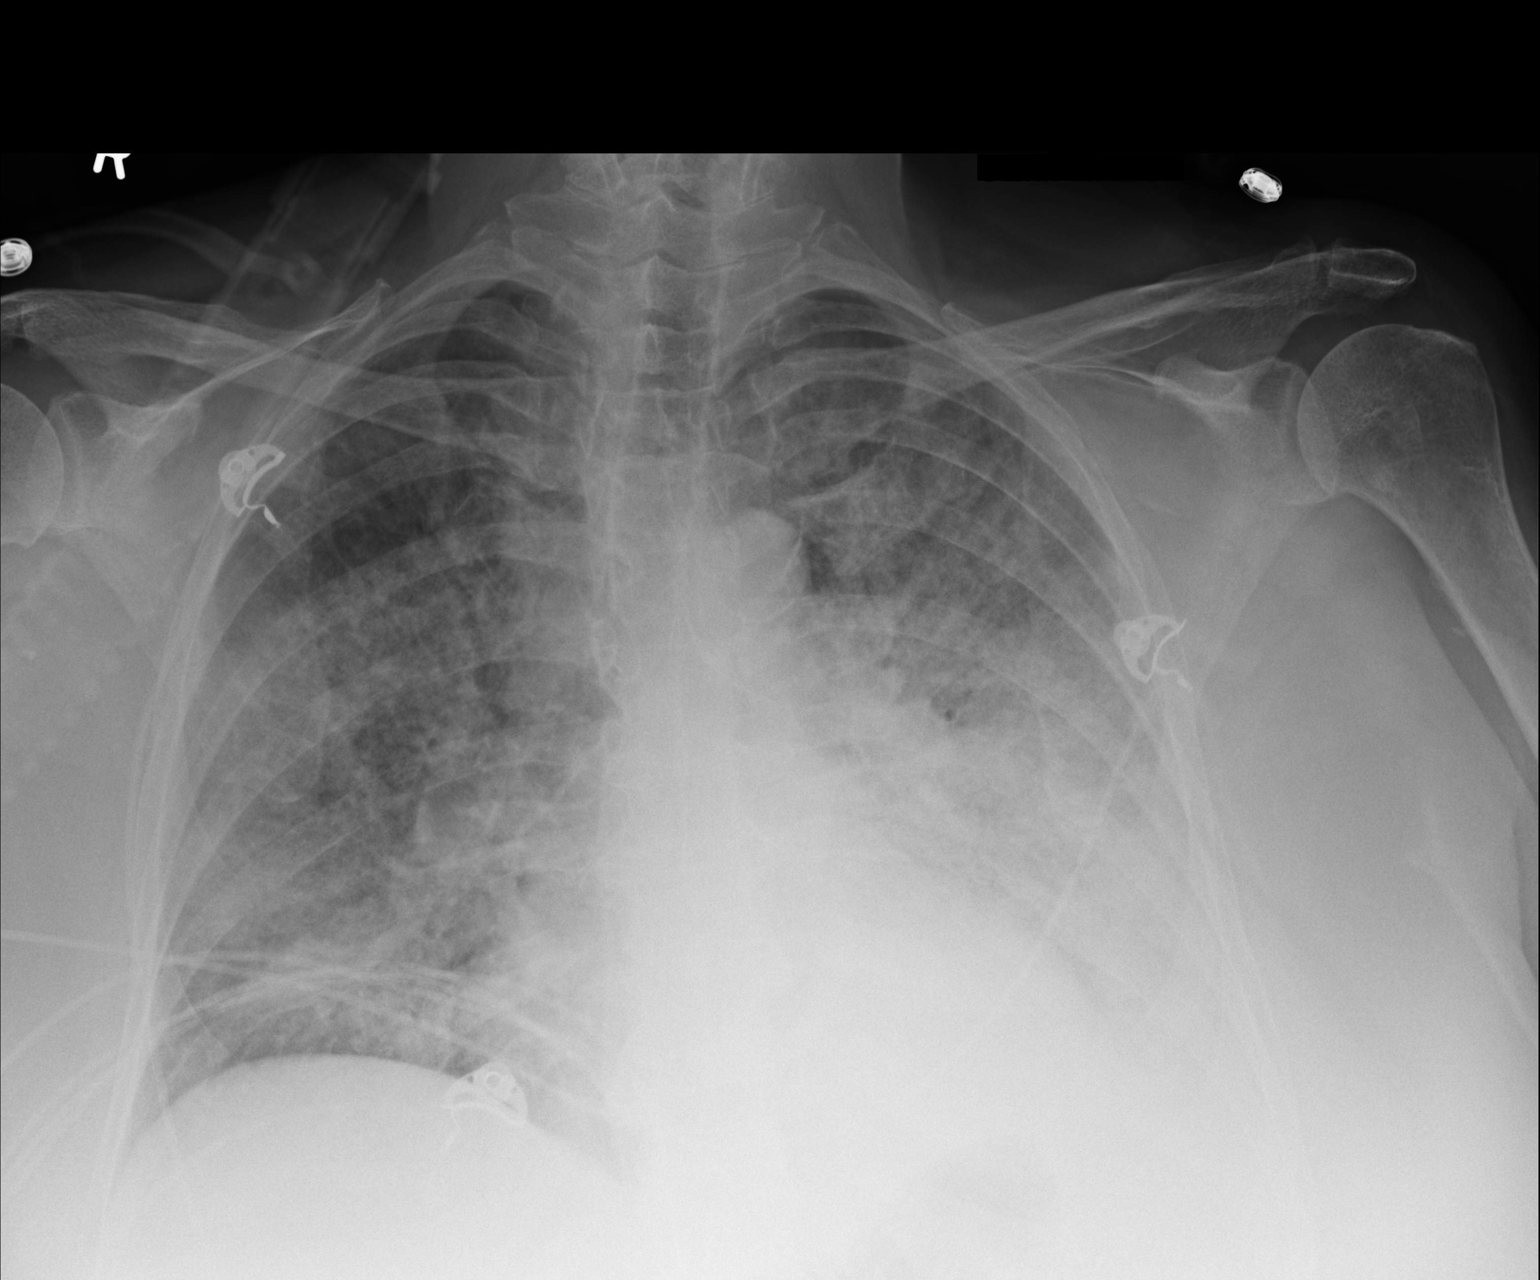

[1 of 1 positions shown; findings below may reference images not displayed]

FINDINGS: Upper normal heart size.

Diffuse bilateral pulmonary infiltrates, greatest at left lower
lobe.

Favor pneumonia over edema.

No definite pulmonary vascular congestion, pleural effusion or
pneumothorax.

Bones unremarkable.
IMPRESSION: Persistent diffuse bilateral pulmonary infiltrates question
pneumonia.

Compared to the previous study, question slight improvement in right
upper lobe aeration.
